# Patient Record
Sex: Female | Born: 1975 | Race: Black or African American | Hispanic: No | Marital: Married | Smoking: Never smoker
Health system: Southern US, Community
[De-identification: ages and names within clinical notes are randomized; demographics above are authoritative.]

## PROBLEM LIST (undated history)

## (undated) DIAGNOSIS — Z789 Other specified health status: Secondary | ICD-10-CM

## (undated) DIAGNOSIS — O24419 Gestational diabetes mellitus in pregnancy, unspecified control: Secondary | ICD-10-CM

## (undated) DIAGNOSIS — K219 Gastro-esophageal reflux disease without esophagitis: Secondary | ICD-10-CM

## (undated) DIAGNOSIS — O139 Gestational [pregnancy-induced] hypertension without significant proteinuria, unspecified trimester: Secondary | ICD-10-CM

## (undated) HISTORY — DX: Gestational (pregnancy-induced) hypertension without significant proteinuria, unspecified trimester: O13.9

## (undated) HISTORY — PX: NO PAST SURGERIES: SHX2092

## (undated) HISTORY — DX: Gastro-esophageal reflux disease without esophagitis: K21.9

## (undated) HISTORY — DX: Gestational diabetes mellitus in pregnancy, unspecified control: O24.419

---

## 1998-11-16 ENCOUNTER — Inpatient Hospital Stay (HOSPITAL_COMMUNITY): Admission: AD | Admit: 1998-11-16 | Discharge: 1998-11-18 | Payer: Self-pay | Admitting: *Deleted

## 1998-11-21 ENCOUNTER — Encounter: Admission: RE | Admit: 1998-11-21 | Discharge: 1999-02-19 | Payer: Self-pay | Admitting: *Deleted

## 2002-03-31 ENCOUNTER — Ambulatory Visit (HOSPITAL_COMMUNITY): Admission: RE | Admit: 2002-03-31 | Discharge: 2002-03-31 | Payer: Self-pay | Admitting: *Deleted

## 2002-04-06 ENCOUNTER — Encounter: Admission: RE | Admit: 2002-04-06 | Discharge: 2002-04-06 | Payer: Self-pay | Admitting: *Deleted

## 2002-07-01 ENCOUNTER — Encounter: Payer: Self-pay | Admitting: Obstetrics and Gynecology

## 2002-07-01 ENCOUNTER — Inpatient Hospital Stay (HOSPITAL_COMMUNITY): Admission: AD | Admit: 2002-07-01 | Discharge: 2002-07-03 | Payer: Self-pay | Admitting: *Deleted

## 2005-04-08 ENCOUNTER — Inpatient Hospital Stay (HOSPITAL_COMMUNITY): Admission: AD | Admit: 2005-04-08 | Discharge: 2005-04-08 | Payer: Self-pay | Admitting: Gynecology

## 2005-04-11 ENCOUNTER — Inpatient Hospital Stay (HOSPITAL_COMMUNITY): Admission: AD | Admit: 2005-04-11 | Discharge: 2005-04-11 | Payer: Self-pay | Admitting: Obstetrics & Gynecology

## 2005-07-07 ENCOUNTER — Ambulatory Visit (HOSPITAL_COMMUNITY): Admission: RE | Admit: 2005-07-07 | Discharge: 2005-07-07 | Payer: Self-pay | Admitting: Gynecology

## 2005-08-04 ENCOUNTER — Ambulatory Visit (HOSPITAL_COMMUNITY): Admission: RE | Admit: 2005-08-04 | Discharge: 2005-08-04 | Payer: Self-pay | Admitting: Obstetrics and Gynecology

## 2005-12-22 ENCOUNTER — Ambulatory Visit: Payer: Self-pay | Admitting: Gynecology

## 2005-12-22 ENCOUNTER — Inpatient Hospital Stay (HOSPITAL_COMMUNITY): Admission: AD | Admit: 2005-12-22 | Discharge: 2005-12-24 | Payer: Self-pay | Admitting: *Deleted

## 2009-09-13 ENCOUNTER — Ambulatory Visit: Payer: Self-pay | Admitting: Nurse Practitioner

## 2009-09-13 ENCOUNTER — Inpatient Hospital Stay (HOSPITAL_COMMUNITY): Admission: AD | Admit: 2009-09-13 | Discharge: 2009-09-14 | Payer: Self-pay | Admitting: Obstetrics and Gynecology

## 2009-09-25 ENCOUNTER — Inpatient Hospital Stay (HOSPITAL_COMMUNITY): Admission: AD | Admit: 2009-09-25 | Discharge: 2009-09-26 | Payer: Self-pay | Admitting: Obstetrics & Gynecology

## 2009-09-25 ENCOUNTER — Ambulatory Visit: Payer: Self-pay | Admitting: Obstetrics and Gynecology

## 2009-10-21 ENCOUNTER — Encounter
Admission: RE | Admit: 2009-10-21 | Discharge: 2009-11-04 | Payer: Self-pay | Source: Home / Self Care | Attending: Obstetrics and Gynecology | Admitting: Obstetrics and Gynecology

## 2009-10-21 ENCOUNTER — Ambulatory Visit: Payer: Self-pay | Admitting: Obstetrics and Gynecology

## 2009-10-22 ENCOUNTER — Ambulatory Visit (HOSPITAL_COMMUNITY): Admission: RE | Admit: 2009-10-22 | Discharge: 2009-10-22 | Payer: Self-pay | Admitting: Family Medicine

## 2009-10-28 ENCOUNTER — Encounter: Payer: Self-pay | Admitting: Family Medicine

## 2009-10-28 ENCOUNTER — Ambulatory Visit: Payer: Self-pay | Admitting: Obstetrics and Gynecology

## 2009-10-28 ENCOUNTER — Ambulatory Visit (HOSPITAL_COMMUNITY): Admission: RE | Admit: 2009-10-28 | Discharge: 2009-10-28 | Payer: Self-pay | Admitting: Family Medicine

## 2009-11-04 ENCOUNTER — Ambulatory Visit: Payer: Self-pay | Admitting: Obstetrics and Gynecology

## 2009-11-18 ENCOUNTER — Ambulatory Visit: Payer: Self-pay | Admitting: Family Medicine

## 2009-11-19 ENCOUNTER — Encounter: Payer: Self-pay | Admitting: Family Medicine

## 2009-11-25 ENCOUNTER — Ambulatory Visit: Payer: Self-pay | Admitting: Obstetrics & Gynecology

## 2009-12-05 ENCOUNTER — Ambulatory Visit (HOSPITAL_COMMUNITY)
Admission: RE | Admit: 2009-12-05 | Discharge: 2009-12-05 | Payer: Self-pay | Source: Home / Self Care | Admitting: Family Medicine

## 2009-12-09 ENCOUNTER — Ambulatory Visit: Payer: Self-pay | Admitting: Obstetrics & Gynecology

## 2009-12-16 ENCOUNTER — Ambulatory Visit: Payer: Self-pay | Admitting: Obstetrics & Gynecology

## 2009-12-30 ENCOUNTER — Ambulatory Visit: Payer: Self-pay | Admitting: Obstetrics and Gynecology

## 2010-01-16 ENCOUNTER — Ambulatory Visit: Payer: Self-pay | Admitting: Obstetrics & Gynecology

## 2010-01-16 ENCOUNTER — Encounter: Payer: Self-pay | Admitting: Family Medicine

## 2010-01-16 ENCOUNTER — Ambulatory Visit (HOSPITAL_COMMUNITY)
Admission: RE | Admit: 2010-01-16 | Discharge: 2010-01-16 | Payer: Self-pay | Source: Home / Self Care | Attending: Family Medicine | Admitting: Family Medicine

## 2010-01-20 ENCOUNTER — Ambulatory Visit: Admit: 2010-01-20 | Payer: Self-pay | Admitting: Obstetrics & Gynecology

## 2010-01-27 ENCOUNTER — Encounter (INDEPENDENT_AMBULATORY_CARE_PROVIDER_SITE_OTHER): Payer: Self-pay | Admitting: *Deleted

## 2010-01-27 ENCOUNTER — Ambulatory Visit
Admission: RE | Admit: 2010-01-27 | Discharge: 2010-01-27 | Payer: Self-pay | Source: Home / Self Care | Attending: Obstetrics & Gynecology | Admitting: Obstetrics & Gynecology

## 2010-01-27 LAB — CONVERTED CEMR LAB
HCT: 28.9 % — ABNORMAL LOW
HIV: NONREACTIVE
Hemoglobin: 8.9 g/dL — ABNORMAL LOW
MCHC: 30.8 g/dL
MCV: 78.3 fL
Platelets: 274 K/uL
RBC: 3.69 M/uL — ABNORMAL LOW
RDW: 13.2 %
WBC: 10.1 10*3/microliter

## 2010-02-03 LAB — POCT URINALYSIS DIPSTICK
Bilirubin Urine: NEGATIVE
Hgb urine dipstick: NEGATIVE
Ketones, ur: NEGATIVE mg/dL
Nitrite: NEGATIVE
Protein, ur: NEGATIVE mg/dL
Specific Gravity, Urine: >=1.03 (ref 1.005–1.030)
Urine Glucose, Fasting: NEGATIVE mg/dL
Urobilinogen, UA: 1 mg/dL (ref 0.0–1.0)
pH: 6 (ref 5.0–8.0)

## 2010-02-09 ENCOUNTER — Encounter: Payer: Self-pay | Admitting: Obstetrics and Gynecology

## 2010-02-10 ENCOUNTER — Ambulatory Visit
Admission: RE | Admit: 2010-02-10 | Discharge: 2010-02-10 | Payer: Self-pay | Source: Home / Self Care | Attending: Obstetrics and Gynecology | Admitting: Obstetrics and Gynecology

## 2010-02-10 ENCOUNTER — Encounter: Payer: Self-pay | Admitting: Family Medicine

## 2010-02-10 LAB — CONVERTED CEMR LAB
Hemoglobin: 9.1 g/dL — ABNORMAL LOW (ref 12.0–15.0)
Iron: 212 ug/dL — ABNORMAL HIGH (ref 42–145)
MCHC: 32.5 g/dL (ref 30.0–36.0)
Platelets: 284 10*3/uL (ref 150–400)
RDW: 14 % (ref 11.5–15.5)
Saturation Ratios: 41 % (ref 20–55)
TIBC: 519 ug/dL — ABNORMAL HIGH (ref 250–470)
UIBC: 307 ug/dL

## 2010-02-13 ENCOUNTER — Encounter: Payer: Self-pay | Admitting: Family Medicine

## 2010-02-13 ENCOUNTER — Ambulatory Visit (HOSPITAL_COMMUNITY)
Admission: RE | Admit: 2010-02-13 | Discharge: 2010-02-13 | Payer: Self-pay | Source: Home / Self Care | Attending: Family Medicine | Admitting: Family Medicine

## 2010-02-13 ENCOUNTER — Other Ambulatory Visit: Payer: Self-pay | Admitting: Family Medicine

## 2010-02-13 DIAGNOSIS — IMO0001 Reserved for inherently not codable concepts without codable children: Secondary | ICD-10-CM

## 2010-02-24 ENCOUNTER — Other Ambulatory Visit: Payer: Self-pay

## 2010-02-24 DIAGNOSIS — O24919 Unspecified diabetes mellitus in pregnancy, unspecified trimester: Secondary | ICD-10-CM

## 2010-02-24 DIAGNOSIS — O30009 Twin pregnancy, unspecified number of placenta and unspecified number of amniotic sacs, unspecified trimester: Secondary | ICD-10-CM

## 2010-02-24 LAB — POCT URINALYSIS DIPSTICK
Bilirubin Urine: NEGATIVE
Nitrite: NEGATIVE
Urine Glucose, Fasting: NEGATIVE mg/dL
Urobilinogen, UA: 0.2 mg/dL (ref 0.0–1.0)

## 2010-03-13 ENCOUNTER — Other Ambulatory Visit: Payer: Self-pay | Admitting: Family Medicine

## 2010-03-13 ENCOUNTER — Ambulatory Visit (HOSPITAL_COMMUNITY)
Admission: RE | Admit: 2010-03-13 | Discharge: 2010-03-13 | Disposition: A | Discharge status: Home / Self Care | Payer: Medicaid Other | Source: Ambulatory Visit | Attending: Family Medicine | Admitting: Family Medicine

## 2010-03-13 ENCOUNTER — Encounter (HOSPITAL_COMMUNITY): Payer: Self-pay

## 2010-03-13 DIAGNOSIS — IMO0001 Reserved for inherently not codable concepts without codable children: Secondary | ICD-10-CM

## 2010-03-13 DIAGNOSIS — O30009 Twin pregnancy, unspecified number of placenta and unspecified number of amniotic sacs, unspecified trimester: Secondary | ICD-10-CM | POA: Insufficient documentation

## 2010-03-13 DIAGNOSIS — O24919 Unspecified diabetes mellitus in pregnancy, unspecified trimester: Secondary | ICD-10-CM | POA: Insufficient documentation

## 2010-03-17 ENCOUNTER — Other Ambulatory Visit: Payer: Self-pay

## 2010-03-17 DIAGNOSIS — O409XX0 Polyhydramnios, unspecified trimester, not applicable or unspecified: Secondary | ICD-10-CM

## 2010-03-17 DIAGNOSIS — IMO0002 Reserved for concepts with insufficient information to code with codable children: Secondary | ICD-10-CM

## 2010-03-17 DIAGNOSIS — O24919 Unspecified diabetes mellitus in pregnancy, unspecified trimester: Secondary | ICD-10-CM

## 2010-03-17 LAB — POCT URINALYSIS DIPSTICK
Bilirubin Urine: NEGATIVE
Ketones, ur: NEGATIVE mg/dL
Nitrite: NEGATIVE
Urine Glucose, Fasting: NEGATIVE mg/dL
pH: 6 (ref 5.0–8.0)

## 2010-03-27 ENCOUNTER — Inpatient Hospital Stay (HOSPITAL_COMMUNITY)
Admission: AD | Admit: 2010-03-27 | Discharge: 2010-03-27 | Disposition: A | Discharge status: Home / Self Care | Payer: Medicaid Other | Source: Ambulatory Visit | Attending: Obstetrics & Gynecology | Admitting: Obstetrics & Gynecology

## 2010-03-27 DIAGNOSIS — R109 Unspecified abdominal pain: Secondary | ICD-10-CM | POA: Insufficient documentation

## 2010-03-27 DIAGNOSIS — O4700 False labor before 37 completed weeks of gestation, unspecified trimester: Secondary | ICD-10-CM | POA: Insufficient documentation

## 2010-03-27 LAB — URINALYSIS, ROUTINE W REFLEX MICROSCOPIC
Bilirubin Urine: NEGATIVE
Nitrite: NEGATIVE
Specific Gravity, Urine: >1.03 — ABNORMAL HIGH (ref 1.005–1.030)
Urobilinogen, UA: 0.2 mg/dL (ref 0.0–1.0)
pH: 6 (ref 5.0–8.0)

## 2010-03-27 LAB — URINE MICROSCOPIC-ADD ON

## 2010-03-31 ENCOUNTER — Ambulatory Visit (HOSPITAL_COMMUNITY)
Admission: RE | Admit: 2010-03-31 | Discharge: 2010-03-31 | Disposition: A | Discharge status: Home / Self Care | Payer: Medicaid Other | Source: Ambulatory Visit | Attending: Obstetrics & Gynecology | Admitting: Obstetrics & Gynecology

## 2010-03-31 ENCOUNTER — Other Ambulatory Visit: Payer: Self-pay | Admitting: Obstetrics & Gynecology

## 2010-03-31 ENCOUNTER — Encounter: Payer: Self-pay | Admitting: Obstetrics & Gynecology

## 2010-03-31 ENCOUNTER — Other Ambulatory Visit: Payer: Self-pay

## 2010-03-31 DIAGNOSIS — O24919 Unspecified diabetes mellitus in pregnancy, unspecified trimester: Secondary | ICD-10-CM

## 2010-03-31 DIAGNOSIS — IMO0002 Reserved for concepts with insufficient information to code with codable children: Secondary | ICD-10-CM

## 2010-03-31 DIAGNOSIS — O30009 Twin pregnancy, unspecified number of placenta and unspecified number of amniotic sacs, unspecified trimester: Secondary | ICD-10-CM | POA: Insufficient documentation

## 2010-03-31 LAB — CONVERTED CEMR LAB
CO2: 20 meq/L (ref 19–32)
Chlamydia, DNA Probe: NEGATIVE
Creatinine, Ser: 0.41 mg/dL (ref 0.40–1.20)
GC Probe Amp, Genital: NEGATIVE
Glucose, Bld: 93 mg/dL (ref 70–99)
HCT: 37.8 % (ref 36.0–46.0)
MCHC: 31.2 g/dL (ref 30.0–36.0)
MCV: 82.4 fL (ref 78.0–100.0)
Platelets: 186 10*3/uL (ref 150–400)
RBC: 4.59 M/uL (ref 3.87–5.11)
Total Bilirubin: 0.2 mg/dL — ABNORMAL LOW (ref 0.3–1.2)
WBC: 8.9 10*3/uL (ref 4.0–10.5)

## 2010-03-31 LAB — POCT URINALYSIS DIPSTICK
Glucose, UA: NEGATIVE mg/dL
Nitrite: NEGATIVE
Nitrite: NEGATIVE
Nitrite: NEGATIVE
Protein, ur: 100 mg/dL — AB
Protein, ur: NEGATIVE mg/dL
Specific Gravity, Urine: 1.025 (ref 1.005–1.030)
Urobilinogen, UA: 0.2 mg/dL (ref 0.0–1.0)
Urobilinogen, UA: 0.2 mg/dL (ref 0.0–1.0)
Urobilinogen, UA: 0.2 mg/dL (ref 0.0–1.0)
pH: 5.5 (ref 5.0–8.0)
pH: 5.5 (ref 5.0–8.0)

## 2010-04-01 ENCOUNTER — Encounter: Payer: Self-pay | Admitting: Obstetrics & Gynecology

## 2010-04-01 LAB — POCT URINALYSIS DIPSTICK
Glucose, UA: NEGATIVE mg/dL
Nitrite: NEGATIVE
Nitrite: NEGATIVE
Protein, ur: NEGATIVE mg/dL
Protein, ur: NEGATIVE mg/dL
Specific Gravity, Urine: 1.025 (ref 1.005–1.030)
Specific Gravity, Urine: >=1.03 (ref 1.005–1.030)
Urobilinogen, UA: 0.2 mg/dL (ref 0.0–1.0)
Urobilinogen, UA: 1 mg/dL (ref 0.0–1.0)

## 2010-04-01 LAB — GLUCOSE, CAPILLARY: Glucose-Capillary: 133 mg/dL — ABNORMAL HIGH (ref 70–99)

## 2010-04-02 ENCOUNTER — Encounter: Payer: Self-pay | Admitting: Advanced Practice Midwife

## 2010-04-02 LAB — POCT URINALYSIS DIPSTICK
Glucose, UA: NEGATIVE mg/dL
Glucose, UA: NEGATIVE mg/dL
Ketones, ur: NEGATIVE mg/dL
Nitrite: NEGATIVE
Nitrite: NEGATIVE
Nitrite: NEGATIVE
Protein, ur: NEGATIVE mg/dL
Protein, ur: NEGATIVE mg/dL
Urobilinogen, UA: 1 mg/dL (ref 0.0–1.0)
Urobilinogen, UA: 1 mg/dL (ref 0.0–1.0)
Urobilinogen, UA: 1 mg/dL (ref 0.0–1.0)
pH: 5.5 (ref 5.0–8.0)

## 2010-04-02 LAB — CONVERTED CEMR LAB
Creatinine 24 HR UR: 1366 mg/24hr (ref 700–1800)
Creatinine, Urine: 116.3 mg/dL

## 2010-04-03 LAB — URINALYSIS, ROUTINE W REFLEX MICROSCOPIC
Glucose, UA: NEGATIVE mg/dL
Nitrite: NEGATIVE
Protein, ur: NEGATIVE mg/dL
Specific Gravity, Urine: >1.03 — ABNORMAL HIGH (ref 1.005–1.030)
Urobilinogen, UA: 4 mg/dL — ABNORMAL HIGH (ref 0.0–1.0)
Urobilinogen, UA: 1 mg/dL (ref 0.0–1.0)

## 2010-04-03 LAB — COMPREHENSIVE METABOLIC PANEL
Albumin: 3.2 g/dL — ABNORMAL LOW (ref 3.5–5.2)
Alkaline Phosphatase: 75 U/L (ref 39–117)
BUN: 6 mg/dL (ref 6–23)
Calcium: 9.1 mg/dL (ref 8.4–10.5)
Creatinine, Ser: 0.39 mg/dL — ABNORMAL LOW (ref 0.4–1.2)
Potassium: 3.8 mEq/L (ref 3.5–5.1)
Total Protein: 7.2 g/dL (ref 6.0–8.3)

## 2010-04-03 LAB — CBC
MCV: 80.4 fL (ref 78.0–100.0)
Platelets: 253 10*3/uL (ref 150–400)
RDW: 14.1 % (ref 11.5–15.5)
WBC: 10.8 10*3/uL — ABNORMAL HIGH (ref 4.0–10.5)

## 2010-04-03 LAB — URINE MICROSCOPIC-ADD ON

## 2010-04-07 ENCOUNTER — Other Ambulatory Visit: Payer: Self-pay

## 2010-04-07 ENCOUNTER — Inpatient Hospital Stay (HOSPITAL_COMMUNITY)
Admission: AD | Admit: 2010-04-07 | Discharge: 2010-04-14 | DRG: 774 | Disposition: A | Discharge status: Home / Self Care | Payer: Medicaid Other | Source: Ambulatory Visit | Attending: Obstetrics and Gynecology | Admitting: Obstetrics and Gynecology

## 2010-04-07 DIAGNOSIS — O99814 Abnormal glucose complicating childbirth: Secondary | ICD-10-CM | POA: Diagnosis present

## 2010-04-07 DIAGNOSIS — O30049 Twin pregnancy, dichorionic/diamniotic, unspecified trimester: Secondary | ICD-10-CM

## 2010-04-07 DIAGNOSIS — IMO0002 Reserved for concepts with insufficient information to code with codable children: Principal | ICD-10-CM | POA: Diagnosis present

## 2010-04-07 DIAGNOSIS — O24919 Unspecified diabetes mellitus in pregnancy, unspecified trimester: Secondary | ICD-10-CM

## 2010-04-07 DIAGNOSIS — O30009 Twin pregnancy, unspecified number of placenta and unspecified number of amniotic sacs, unspecified trimester: Secondary | ICD-10-CM

## 2010-04-07 LAB — POCT URINALYSIS DIP (DEVICE)
Specific Gravity, Urine: >=1.03 (ref 1.005–1.030)
pH: 5.5 (ref 5.0–8.0)

## 2010-04-07 LAB — CBC
HCT: 36.9 % (ref 36.0–46.0)
Hemoglobin: 11.5 g/dL — ABNORMAL LOW (ref 12.0–15.0)
MCH: 25.3 pg — ABNORMAL LOW (ref 26.0–34.0)
MCHC: 31.2 g/dL (ref 30.0–36.0)
MCV: 81.1 fL (ref 78.0–100.0)

## 2010-04-07 LAB — COMPREHENSIVE METABOLIC PANEL
CO2: 23 mEq/L (ref 19–32)
Calcium: 8.4 mg/dL (ref 8.4–10.5)
Chloride: 106 mEq/L (ref 96–112)
Creatinine, Ser: 0.46 mg/dL (ref 0.4–1.2)
GFR calc non Af Amer: >60 mL/min (ref >60)
Glucose, Bld: 78 mg/dL (ref 70–99)
Total Bilirubin: 0.4 mg/dL (ref 0.3–1.2)

## 2010-04-07 LAB — LACTATE DEHYDROGENASE: LDH: 150 U/L (ref 94–250)

## 2010-04-07 LAB — GLUCOSE, CAPILLARY: Glucose-Capillary: 96 mg/dL (ref 70–99)

## 2010-04-08 ENCOUNTER — Observation Stay (HOSPITAL_COMMUNITY): Payer: Medicaid Other

## 2010-04-08 LAB — PROTEIN, URINE, 24 HOUR
Collection Interval-UPROT: 24 hours
Protein, Urine: 98 mg/dL

## 2010-04-08 LAB — CREATININE CLEARANCE, URINE, 24 HOUR: Urine Total Volume-CRCL: 2000 mL

## 2010-04-08 LAB — GLUCOSE, CAPILLARY
Glucose-Capillary: 129 mg/dL — ABNORMAL HIGH (ref 70–99)
Glucose-Capillary: 105 mg/dL — ABNORMAL HIGH (ref 70–99)

## 2010-04-09 LAB — GLUCOSE, CAPILLARY: Glucose-Capillary: 110 mg/dL — ABNORMAL HIGH (ref 70–99)

## 2010-04-10 ENCOUNTER — Ambulatory Visit (HOSPITAL_COMMUNITY): Admission: RE | Admit: 2010-04-10 | Payer: Medicaid Other | Source: Ambulatory Visit

## 2010-04-10 ENCOUNTER — Ambulatory Visit (HOSPITAL_COMMUNITY): Payer: Medicaid Other

## 2010-04-10 ENCOUNTER — Other Ambulatory Visit: Payer: Self-pay | Admitting: Family Medicine

## 2010-04-10 DIAGNOSIS — O30049 Twin pregnancy, dichorionic/diamniotic, unspecified trimester: Secondary | ICD-10-CM

## 2010-04-10 DIAGNOSIS — O30009 Twin pregnancy, unspecified number of placenta and unspecified number of amniotic sacs, unspecified trimester: Secondary | ICD-10-CM

## 2010-04-10 DIAGNOSIS — IMO0002 Reserved for concepts with insufficient information to code with codable children: Secondary | ICD-10-CM

## 2010-04-10 DIAGNOSIS — O99814 Abnormal glucose complicating childbirth: Secondary | ICD-10-CM

## 2010-04-10 LAB — GLUCOSE, CAPILLARY
Glucose-Capillary: 81 mg/dL (ref 70–99)
Glucose-Capillary: 76 mg/dL (ref 70–99)

## 2010-04-10 LAB — CBC
HCT: 38.9 % (ref 36.0–46.0)
Hemoglobin: 12.2 g/dL (ref 12.0–15.0)
RDW: 18.4 % — ABNORMAL HIGH (ref 11.5–15.5)
WBC: 9.7 10*3/uL (ref 4.0–10.5)

## 2010-04-11 LAB — CBC
HCT: 31.7 % — ABNORMAL LOW (ref 36.0–46.0)
Platelets: 159 10*3/uL (ref 150–400)
RDW: 18.2 % — ABNORMAL HIGH (ref 11.5–15.5)
WBC: 12 10*3/uL — ABNORMAL HIGH (ref 4.0–10.5)

## 2010-04-11 LAB — GLUCOSE, CAPILLARY: Glucose-Capillary: 65 mg/dL — ABNORMAL LOW (ref 70–99)

## 2010-04-28 NOTE — Discharge Summary (Signed)
  NAMEHERAN, CAMPAU                   ACCOUNT NO.:  [id number]  MEDICAL RECORD NO.:  [id number]           PATIENT TYPE:  I  LOCATION:  9144                          FACILITY:  [facility]  PHYSICIAN:  Lauren Chin, MD DATE OF BIRTH:  1975-02-16  DATE OF ADMISSION:  04/07/2010 DATE OF DISCHARGE:  04/14/2010                              DISCHARGE SUMMARY   Lauren Doyle was admitted on April 07, 2010, for pregnancy at 36 weeks and 4 days with elevated blood pressure and proteinuria.  After several days of evaluation on antenatal unit, the patient's blood pressures became unstable and a 24-hour urine showed proteinuria.  At that time, it was felt prudent to induce for severe preeclampsia.  She labored well and delivered viable female and female infants vaginally without any complication, was placed on mag sulfate therapy prior to and 24 hours after delivery.  At that time after delivery, she was placed on labetalol 200 b.i.d. and then hydrochlorothiazide was also added to the regimen which she has responded well to in the past 24 hours with only one elevation of a diastolic of 105 that was less than prior to taking her bedtime labetalol.  She is up ambulating well, taking p.o. fluids and solids well, and is ready for discharge today.  Discharge medications are as follows: 1. Hydrochlorothiazide 25 mg p.o. q.a.m. 2. Labetalol 200 b.i.d. 3. Percocet 5/325 one p.o. q.4 h. p.r.n. pain. 4. Motrin 600 p.o. 6 hours p.r.n. cramping.  PHYSICAL EXAMINATION TODAY:  HEART:  Regular in rhythm and rate. LUNGS:  Clear to auscultation bilaterally. ABDOMEN:  Soft.  Fundus is firm. VAGINA:  Lochia small amount. EXTREMITIES:  There is 1+ pitting edema in the lower extremities.  ASSESSMENT:  Stable postpartum day #4 preeclampsia.  PLAN:  We are going to discharge her home.  The nurse is going to follow up on postpartum day 5 through 7 for her blood pressure check to see how she and the twins are doing and she  is to follow up at [facility] Department in 6 weeks.     Lauren Doyle, N.M.   ______________________________ Lauren Chin, MD    DL/MEDQ  D:  91/47/8295  T:  04/15/2010  Job:  [id number]  cc:   [facility] Department  Electronically Signed by Wyvonnia Dusky N.M. on 04/20/2010 10:32:56 AM Electronically Signed by Jaynie Collins MD on 04/28/2010 11:53:13 AM

## 2010-11-04 ENCOUNTER — Encounter (HOSPITAL_COMMUNITY): Payer: Self-pay | Admitting: *Deleted

## 2011-02-14 IMAGING — US US OB DETAIL+14 WK
1 series · 13 of 28 positions shown · non-contrast
Comparison: none

[Series 1: us ob detail+14 wk · 13 of 147 slices shown]
[im 6/147]
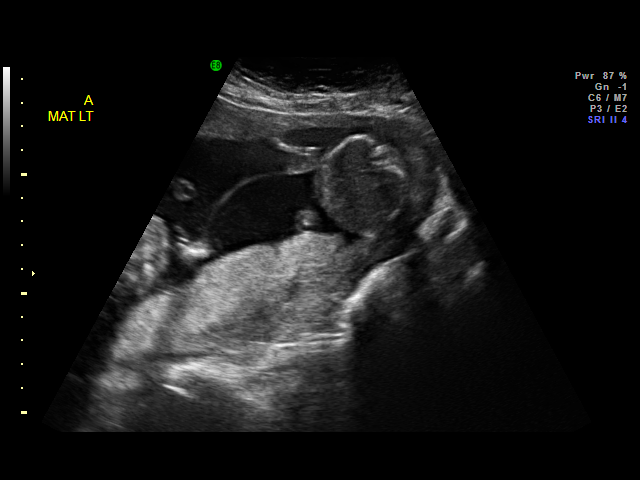
[im 17/147]
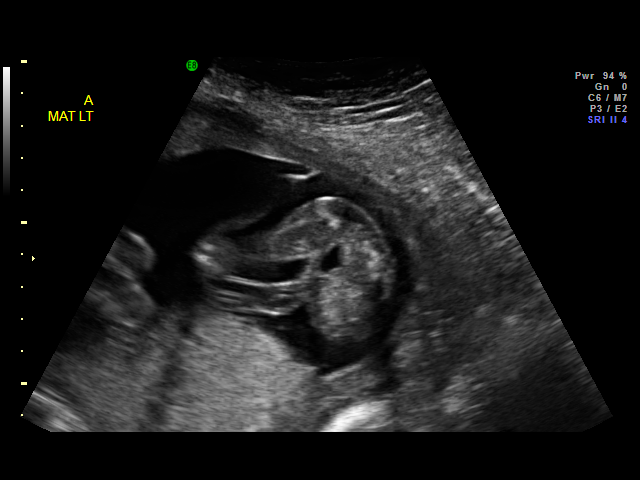
[im 28/147]
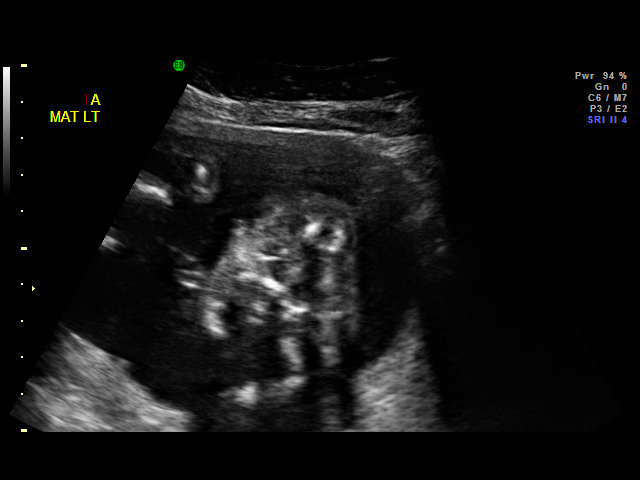
[im 38/147]
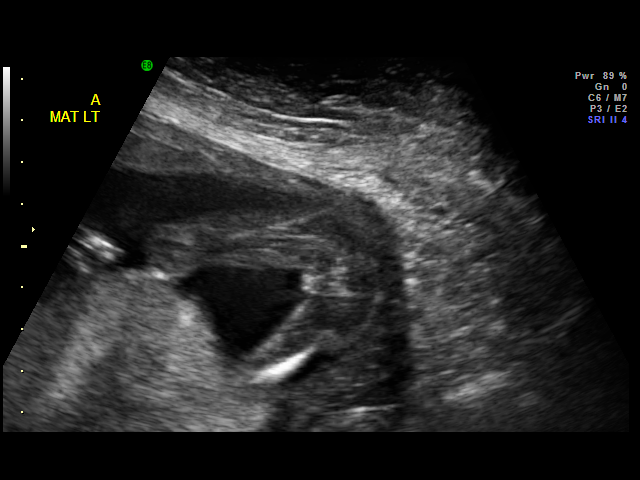
[im 49/147]
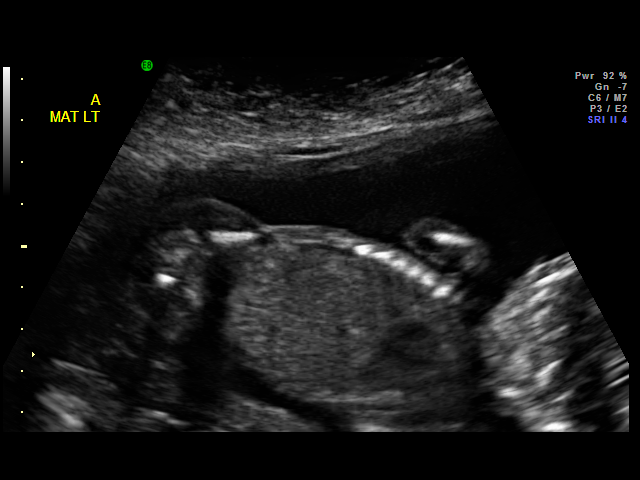
[im 60/147]
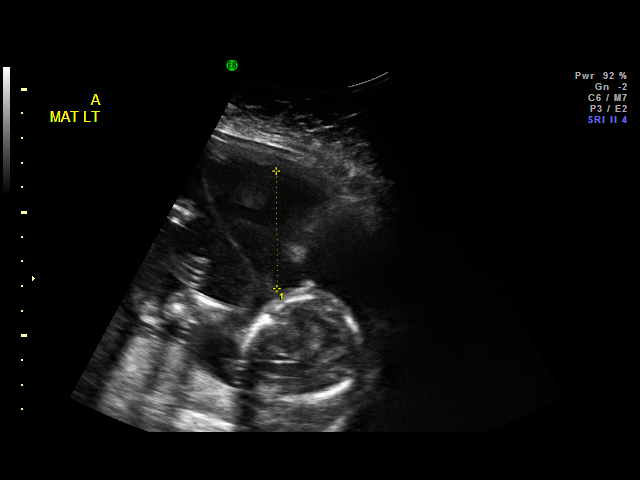
[im 76/147]
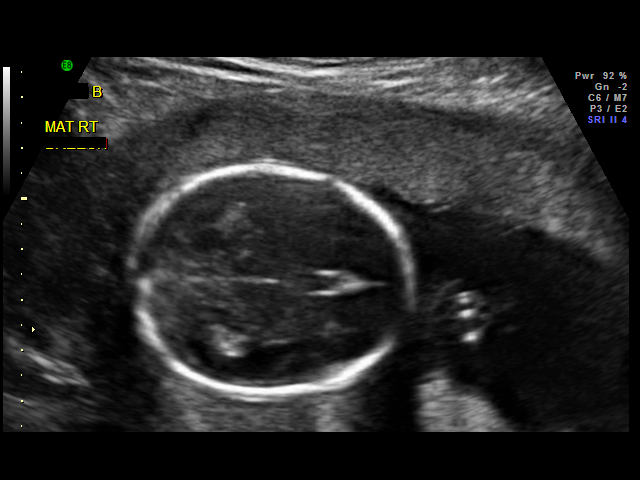
[im 87/147]
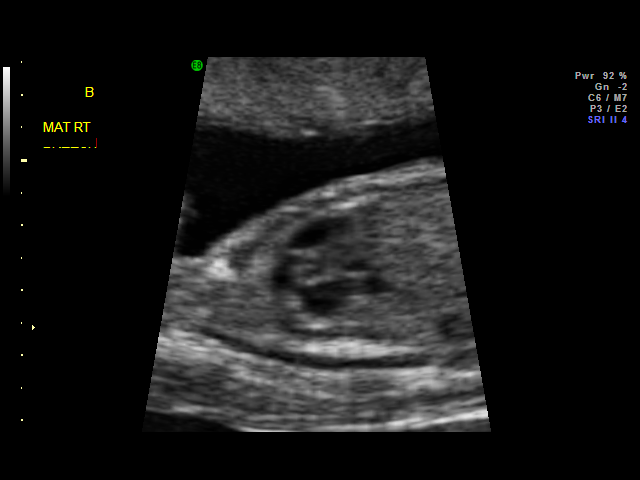
[im 98/147]
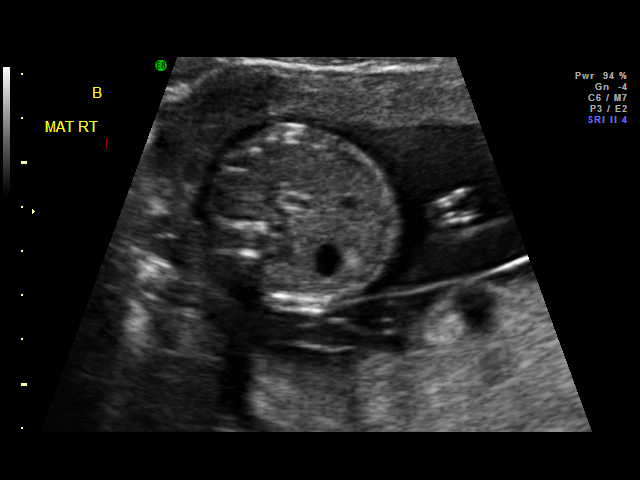
[im 109/147]
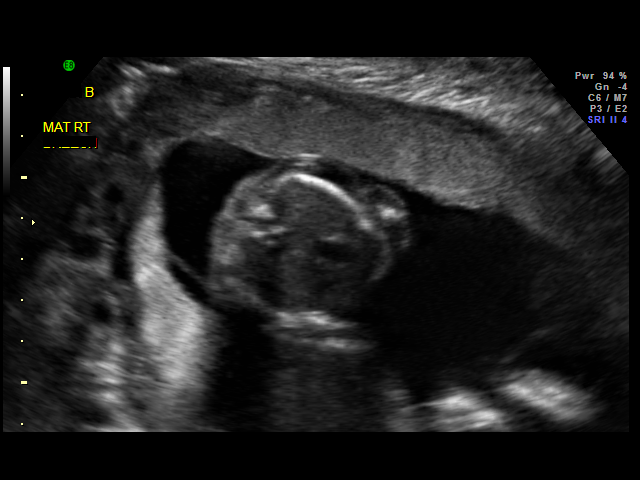
[im 119/147]
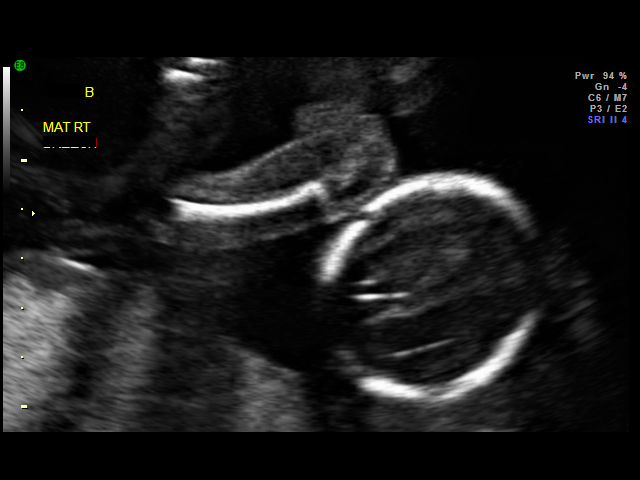
[im 130/147]
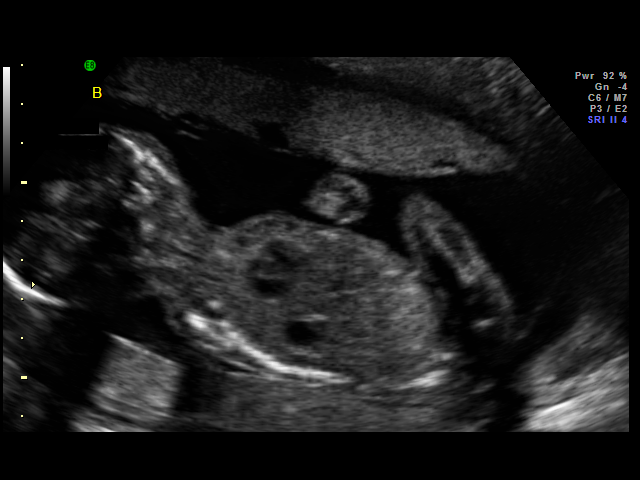
[im 141/147]
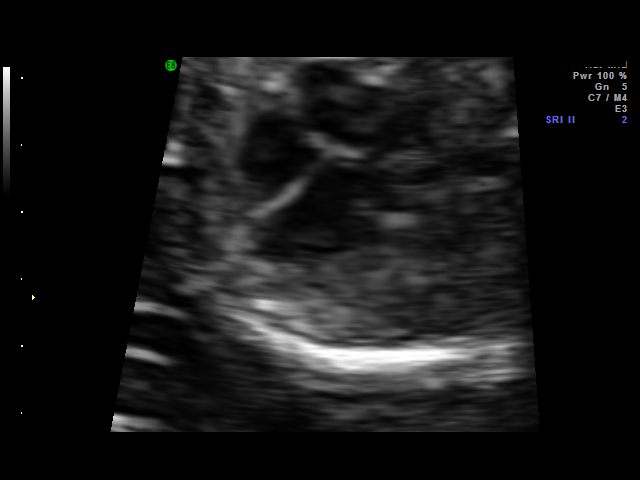

[13 of 28 positions shown; findings below may reference images not displayed]

OBSTETRICS REPORT
                      (Signed Final 12/05/2009 [DATE])

                 [REDACTED]-                       [HOSPITAL]
                 Faculty Physician
 Order#:         _O
Procedures

 US OB DETAIL +14 WK                                   [id number]
 US OB Detail Add'l Gest + 14 WK                       [id number]
Indications

 Twin gestation, Di-Di
 Diabetes - Pregestational, Class B
Fetal Evaluation (Fetus A)

 Fetal Heart Rate:  135                          bpm
 Cardiac Activity:  Observed
 Fetal Lie:         Maternal left side
 Presentation:      Cephalic
 Placenta:          Posterior, above cervical
                    os
 P. Cord            Visualized
 Insertion:

 Amniotic Fluid
 AFI FV:      Subjectively within normal limits
                                             Larg Pckt:     4.8  cm
Biometry (Fetus A)

 BPD:     44.7  mm     G. Age:  19w 4d                CI:         80.1   70 - 86
 OFD:     55.8  mm                                    FL/HC:      16.8   16.1 -

 HC:     160.3  mm     G. Age:  18w 6d       50  %    HC/AC:      1.24   1.09 -

 AC:     129.6  mm     G. Age:  18w 4d       40  %    FL/BPD:
 FL:        27  mm     G. Age:  18w 1d       27  %    FL/AC:      20.8   20 - 24
 HUM:     27.5  mm     G. Age:  18w 6d       54  %
 CER:     18.9  mm     G. Age:  18w 3d       41  %
 NFT:     2.95  mm

 Est. FW:     241  gm      0 lb 9 oz     40  %     FW Discordancy         8  %
Gestational Age (Fetus A)
 U/S Today:     18w 5d                                        EDD:   05/03/10
 Best:          18w 5d     Det. By:  Early Ultrasound         EDD:   05/03/10
                                     (09/14/09)
Anatomy (Fetus A)

 Cranium:           Appears normal      Aortic Arch:       Appears normal
 Fetal Cavum:       Appears normal      Ductal Arch:       Appears normal
 Ventricles:        Appears normal      Diaphragm:         Appears normal
 Choroid Plexus:    Appears normal      Stomach:           Appears normal
 Cerebellum:        Appears normal      Abdomen:           Appears normal
 Posterior Fossa:   Appears normal      Abdominal Wall:    Appears nml
                                                           (cord insert,
                                                           abd wall)
 Nuchal Fold:       Appears normal      Cord Vessels:      Appears normal
                    (neck, nuchal                          (3 vessel cord)
                    fold)
 Face:              Appears normal      Kidneys:           Appear normal
                    (lips/profile/orbit
                    s)
 Heart:             Appears normal      Bladder:           Appears normal
                    (4 chamber &
                    axis)
 RVOT:              Appears normal      Spine:             Appears normal
 LVOT:              Appears normal      Limbs:             Appears normal
                                                           (hands, ankles,
                                                           feet)

 Other:     Female gender. Heels and 5th digit visualized.

Fetal Evaluation (Fetus B)

 Fetal Heart Rate:  143                          bpm
 Cardiac Activity:  Observed
 Fetal Lie:         Maternal right side
 Presentation:      Breech
 Placenta:          Anterior, above cervical os
 P. Cord            Visualized
 Insertion:

 Amniotic Fluid
 AFI FV:      Subjectively within normal limits
                                             Larg Pckt:     5.5  cm
Biometry (Fetus B)

 BPD:     44.5  mm     G. Age:  19w 3d                CI:         77.7   70 - 86
 OFD:     57.3  mm                                    FL/HC:      17.4   16.1 -

 HC:     163.9  mm     G. Age:  19w 1d       63  %    HC/AC:      1.22   1.09 -

 AC:     134.4  mm     G. Age:  19w 0d       53  %    FL/BPD:     64.0   N/A
 FL:      28.5  mm     G. Age:  18w 5d       45  %    FL/AC:      21.2   20 - 24
 HUM:     27.6  mm     G. Age:  18w 6d       55  %
 CER:     18.5  mm     G. Age:  18w 2d       35  %
 NFT:     3.22  mm

 Est. FW:     262  gm      0 lb 9 oz     49  %     FW Discordancy      0 \ 8 %
Gestational Age (Fetus B)
 U/S Today:     19w 0d                                        EDD:   05/01/10
 Best:          18w 5d     Det. By:  Early Ultrasound         EDD:   05/03/10
                                     (09/14/09)
Anatomy (Fetus B)

 Cranium:           Appears normal      Aortic Arch:       Appears normal
 Fetal Cavum:       Appears normal      Ductal Arch:       Appears normal
 Ventricles:        Appears normal      Diaphragm:         Appears normal
 Choroid Plexus:    Appears normal      Stomach:           Appears normal
 Cerebellum:        Appears normal      Abdomen:           Appears normal
 Posterior Fossa:   Appears normal      Abdominal Wall:    Appears nml
                                                           (cord insert,
                                                           abd wall)
 Nuchal Fold:       Appears normal      Cord Vessels:      Appears normal
                    (neck, nuchal                          (3 vessel cord)
                    fold)
 Face:              Appears normal      Kidneys:           Appear normal
                    (lips/profile/orbit
                    s)
 Heart:             Appears normal      Bladder:           Appears normal
                    (4 chamber &
                    axis)
 RVOT:              Appears normal      Spine:             Appears normal
 LVOT:              Appears normal      Limbs:             Appears normal
                                                           (hands, ankles,
                                                           feet)

 Other:     Male gender. Heels visualized. 5th digit appears normal.
Cervix Uterus Adnexa

 Cervical Length:    4.3      cm

 Cervix:       Normal appearance by transabdominal scan.

 Left Ovary:    Within normal limits.
 Right Ovary:   Within normal limits.
 Adnexa:     No abnormality visualized.
Impression

 Dichorionic/diamniotic twin pregnancy at 18+2 weeks
 Normal level II fetal anatomy x 2
 Markers of aneuploidy: none x 2
 Normal amniotic fluid volume x 2
 Measurements consistant with prior U/S x 2
Recommendations

 Follow-up ultrasound for growth in 6 weeks

 questions or concerns.

## 2013-11-20 ENCOUNTER — Encounter (HOSPITAL_COMMUNITY): Payer: Self-pay | Admitting: *Deleted

## 2014-12-25 LAB — OB RESULTS CONSOLE GC/CHLAMYDIA
CHLAMYDIA, DNA PROBE: NEGATIVE
Gonorrhea: NEGATIVE

## 2014-12-25 LAB — OB RESULTS CONSOLE RUBELLA ANTIBODY, IGM: Rubella: IMMUNE

## 2014-12-25 LAB — OB RESULTS CONSOLE ABO/RH: RH Type: POSITIVE

## 2014-12-25 LAB — OB RESULTS CONSOLE ANTIBODY SCREEN: ANTIBODY SCREEN: NEGATIVE

## 2014-12-25 LAB — OB RESULTS CONSOLE RPR: RPR: NONREACTIVE

## 2014-12-25 LAB — OB RESULTS CONSOLE HIV ANTIBODY (ROUTINE TESTING)
HIV: NONREACTIVE
HIV: NONREACTIVE

## 2014-12-25 LAB — OB RESULTS CONSOLE VARICELLA ZOSTER ANTIBODY, IGG: Varicella: NON-IMMUNE/NOT IMMUNE

## 2014-12-25 LAB — CYSTIC FIBROSIS MUTATION 97: Cystic Fibrosis Mutat: NEGATIVE

## 2014-12-25 LAB — OB RESULTS CONSOLE HEPATITIS B SURFACE ANTIGEN: Hepatitis B Surface Ag: NEGATIVE

## 2014-12-26 ENCOUNTER — Other Ambulatory Visit (HOSPITAL_COMMUNITY): Payer: Self-pay | Admitting: Urology

## 2014-12-26 DIAGNOSIS — Z3689 Encounter for other specified antenatal screening: Secondary | ICD-10-CM

## 2014-12-26 DIAGNOSIS — O09522 Supervision of elderly multigravida, second trimester: Secondary | ICD-10-CM

## 2014-12-26 DIAGNOSIS — Z3A28 28 weeks gestation of pregnancy: Secondary | ICD-10-CM

## 2014-12-27 ENCOUNTER — Ambulatory Visit (HOSPITAL_COMMUNITY): Payer: Medicaid Other

## 2015-01-03 ENCOUNTER — Encounter (HOSPITAL_COMMUNITY): Payer: Medicaid Other

## 2015-01-03 ENCOUNTER — Other Ambulatory Visit (HOSPITAL_COMMUNITY): Payer: Self-pay | Admitting: Urology

## 2015-01-03 ENCOUNTER — Encounter (HOSPITAL_COMMUNITY): Payer: Self-pay

## 2015-01-03 ENCOUNTER — Ambulatory Visit (HOSPITAL_COMMUNITY)
Admission: RE | Admit: 2015-01-03 | Discharge: 2015-01-03 | Disposition: A | Discharge status: Home / Self Care | Payer: Medicaid Other | Source: Ambulatory Visit | Attending: Physician Assistant | Admitting: Physician Assistant

## 2015-01-03 DIAGNOSIS — O09219 Supervision of pregnancy with history of pre-term labor, unspecified trimester: Secondary | ICD-10-CM

## 2015-01-03 DIAGNOSIS — Z8632 Personal history of gestational diabetes: Secondary | ICD-10-CM

## 2015-01-03 DIAGNOSIS — O09899 Supervision of other high risk pregnancies, unspecified trimester: Secondary | ICD-10-CM

## 2015-01-03 DIAGNOSIS — O09292 Supervision of pregnancy with other poor reproductive or obstetric history, second trimester: Secondary | ICD-10-CM | POA: Insufficient documentation

## 2015-01-03 DIAGNOSIS — O09522 Supervision of elderly multigravida, second trimester: Secondary | ICD-10-CM | POA: Insufficient documentation

## 2015-01-03 DIAGNOSIS — O0933 Supervision of pregnancy with insufficient antenatal care, third trimester: Secondary | ICD-10-CM

## 2015-01-03 DIAGNOSIS — Z3689 Encounter for other specified antenatal screening: Secondary | ICD-10-CM

## 2015-01-03 DIAGNOSIS — O09212 Supervision of pregnancy with history of pre-term labor, second trimester: Secondary | ICD-10-CM | POA: Insufficient documentation

## 2015-01-03 DIAGNOSIS — Z36 Encounter for antenatal screening of mother: Secondary | ICD-10-CM | POA: Insufficient documentation

## 2015-01-03 DIAGNOSIS — Z3A28 28 weeks gestation of pregnancy: Secondary | ICD-10-CM | POA: Diagnosis not present

## 2015-01-03 DIAGNOSIS — O09299 Supervision of pregnancy with other poor reproductive or obstetric history, unspecified trimester: Secondary | ICD-10-CM

## 2015-01-20 NOTE — L&D Delivery Note (Signed)
Delivery Note At 3:17 PM a viable and healthy female was delivered via Vaginal, Spontaneous Delivery (Presentation: Left Occiput Anterior).  APGAR: 7, 9; weight pending .   Placenta status: Intact, Spontaneous.  Cord: 3 vessels with the following complications: None.   Anesthesia: Epidural  Episiotomy: None Lacerations: None Suture Repair: Not applicable Est. Blood Loss (mL): 200  Mom to postpartum.  Baby to Couplet care / Skin to Skin. Lauren Doyle is a 40 y.o. female [phone] with IUP at 7990w5d admitted for active labor.  She progressed without augmentation to complete and pushed less than 30 minutes to deliver.  Cord clamping delayed by 1-2 minutes then clamped by CNM and cut by Sharen CounterLisa [name].  Bulb syringe used to suction infant.  Infant not transitioning well.  Taken to warmer for further evaluation.  Infant's apgar improved to 9 at 5 minutes.  Placenta intact and spontaneous, bleeding minimal.  Intact perineum.  Mom and baby stable prior to transfer to postpartum.   [name], [name] 03/25/2015, 4:07 PM

## 2015-02-13 ENCOUNTER — Other Ambulatory Visit (HOSPITAL_COMMUNITY): Payer: Self-pay | Admitting: Maternal and Fetal Medicine

## 2015-02-13 DIAGNOSIS — O09523 Supervision of elderly multigravida, third trimester: Secondary | ICD-10-CM

## 2015-02-13 DIAGNOSIS — O0930 Supervision of pregnancy with insufficient antenatal care, unspecified trimester: Secondary | ICD-10-CM

## 2015-02-13 DIAGNOSIS — Z3A34 34 weeks gestation of pregnancy: Secondary | ICD-10-CM

## 2015-02-13 DIAGNOSIS — O09213 Supervision of pregnancy with history of pre-term labor, third trimester: Secondary | ICD-10-CM

## 2015-02-13 DIAGNOSIS — Z8632 Personal history of gestational diabetes: Secondary | ICD-10-CM

## 2015-02-13 DIAGNOSIS — Z8759 Personal history of other complications of pregnancy, childbirth and the puerperium: Secondary | ICD-10-CM

## 2015-02-13 DIAGNOSIS — O09893 Supervision of other high risk pregnancies, third trimester: Secondary | ICD-10-CM

## 2015-02-14 ENCOUNTER — Encounter (HOSPITAL_COMMUNITY): Payer: Self-pay

## 2015-02-14 ENCOUNTER — Ambulatory Visit (HOSPITAL_COMMUNITY)
Admission: RE | Admit: 2015-02-14 | Discharge: 2015-02-14 | Disposition: A | Discharge status: Home / Self Care | Payer: Medicaid Other | Source: Ambulatory Visit | Attending: Obstetrics and Gynecology | Admitting: Obstetrics and Gynecology

## 2015-02-14 DIAGNOSIS — O09213 Supervision of pregnancy with history of pre-term labor, third trimester: Secondary | ICD-10-CM | POA: Diagnosis not present

## 2015-02-14 DIAGNOSIS — O0933 Supervision of pregnancy with insufficient antenatal care, third trimester: Secondary | ICD-10-CM | POA: Insufficient documentation

## 2015-02-14 DIAGNOSIS — Z8759 Personal history of other complications of pregnancy, childbirth and the puerperium: Secondary | ICD-10-CM

## 2015-02-14 DIAGNOSIS — O09293 Supervision of pregnancy with other poor reproductive or obstetric history, third trimester: Secondary | ICD-10-CM | POA: Insufficient documentation

## 2015-02-14 DIAGNOSIS — Z3A34 34 weeks gestation of pregnancy: Secondary | ICD-10-CM | POA: Diagnosis not present

## 2015-02-14 DIAGNOSIS — O09523 Supervision of elderly multigravida, third trimester: Secondary | ICD-10-CM | POA: Diagnosis present

## 2015-02-14 DIAGNOSIS — O0930 Supervision of pregnancy with insufficient antenatal care, unspecified trimester: Secondary | ICD-10-CM

## 2015-02-14 DIAGNOSIS — O09893 Supervision of other high risk pregnancies, third trimester: Secondary | ICD-10-CM

## 2015-02-14 DIAGNOSIS — Z8632 Personal history of gestational diabetes: Secondary | ICD-10-CM

## 2015-03-25 ENCOUNTER — Encounter (HOSPITAL_COMMUNITY): Payer: Self-pay

## 2015-03-25 ENCOUNTER — Inpatient Hospital Stay (HOSPITAL_COMMUNITY)
Admission: AD | Admit: 2015-03-25 | Discharge: 2015-03-27 | DRG: 775 | Disposition: A | Discharge status: Home / Self Care | Payer: Medicaid Other | Source: Ambulatory Visit | Attending: Family Medicine | Admitting: Family Medicine

## 2015-03-25 DIAGNOSIS — Z3490 Encounter for supervision of normal pregnancy, unspecified, unspecified trimester: Secondary | ICD-10-CM

## 2015-03-25 DIAGNOSIS — O99824 Streptococcus B carrier state complicating childbirth: Secondary | ICD-10-CM | POA: Diagnosis present

## 2015-03-25 DIAGNOSIS — Z3A39 39 weeks gestation of pregnancy: Secondary | ICD-10-CM

## 2015-03-25 DIAGNOSIS — IMO0001 Reserved for inherently not codable concepts without codable children: Secondary | ICD-10-CM

## 2015-03-25 HISTORY — DX: Other specified health status: Z78.9

## 2015-03-25 LAB — CBC
HCT: 28.9 % — ABNORMAL LOW (ref 36.0–46.0)
Hemoglobin: 8.7 g/dL — ABNORMAL LOW (ref 12.0–15.0)
MCH: 20.8 pg — AB (ref 26.0–34.0)
MCHC: 30.1 g/dL (ref 30.0–36.0)
MCV: 69.1 fL — AB (ref 78.0–100.0)
PLATELETS: 262 10*3/uL (ref 150–400)
RBC: 4.18 MIL/uL (ref 3.87–5.11)
RDW: 16.8 % — AB (ref 11.5–15.5)
WBC: 8.3 10*3/uL (ref 4.0–10.5)

## 2015-03-25 LAB — POCT FERN TEST: POCT Fern Test: POSITIVE

## 2015-03-25 LAB — OB RESULTS CONSOLE GBS: STREP GROUP B AG: POSITIVE

## 2015-03-25 MED ORDER — OXYCODONE-ACETAMINOPHEN 5-325 MG PO TABS: 2.0000 | ORAL_TABLET | ORAL | Status: DC | PRN

## 2015-03-25 MED ORDER — DIPHENHYDRAMINE HCL 25 MG PO CAPS: 25.0000 mg | ORAL_CAPSULE | Freq: Four times a day (QID) | ORAL | Status: DC | PRN

## 2015-03-25 MED ORDER — ONDANSETRON HCL 4 MG PO TABS: 4.0000 mg | ORAL_TABLET | ORAL | Status: DC | PRN

## 2015-03-25 MED ORDER — IBUPROFEN 600 MG PO TABS
600.0000 mg | ORAL_TABLET | Freq: Four times a day (QID) | ORAL | Status: DC
  Administered 2015-03-25 – 2015-03-27 (×7): 600 mg via ORAL
  Filled 2015-03-25 (×7): qty 1

## 2015-03-25 MED ORDER — ONDANSETRON HCL 4 MG/2ML IJ SOLN: 4.0000 mg | Freq: Four times a day (QID) | INTRAMUSCULAR | Status: DC | PRN

## 2015-03-25 MED ORDER — ZOLPIDEM TARTRATE 5 MG PO TABS: 5.0000 mg | ORAL_TABLET | Freq: Every evening | ORAL | Status: DC | PRN

## 2015-03-25 MED ORDER — DIBUCAINE 1 % RE OINT: 1.0000 "application " | TOPICAL_OINTMENT | RECTAL | Status: DC | PRN

## 2015-03-25 MED ORDER — LACTATED RINGERS IV SOLN: 500.0000 mL | INTRAVENOUS | Status: DC | PRN

## 2015-03-25 MED ORDER — LIDOCAINE HCL (PF) 1 % IJ SOLN
30.0000 mL | INTRAMUSCULAR | Status: DC | PRN
  Filled 2015-03-25: qty 30

## 2015-03-25 MED ORDER — LIDOCAINE HCL (PF) 1 % IJ SOLN
INTRAMUSCULAR | Status: AC
  Filled 2015-03-25: qty 30

## 2015-03-25 MED ORDER — OXYCODONE-ACETAMINOPHEN 5-325 MG PO TABS: 1.0000 | ORAL_TABLET | ORAL | Status: DC | PRN

## 2015-03-25 MED ORDER — CITRIC ACID-SODIUM CITRATE 334-500 MG/5ML PO SOLN: 30.0000 mL | ORAL | Status: DC | PRN

## 2015-03-25 MED ORDER — BENZOCAINE-MENTHOL 20-0.5 % EX AERO
1.0000 "application " | INHALATION_SPRAY | CUTANEOUS | Status: DC | PRN
  Administered 2015-03-25: 1 via TOPICAL
  Filled 2015-03-25: qty 56

## 2015-03-25 MED ORDER — PRENATAL MULTIVITAMIN CH
1.0000 | ORAL_TABLET | Freq: Every day | ORAL | Status: DC
  Administered 2015-03-26 – 2015-03-27 (×2): 1 via ORAL
  Filled 2015-03-25 (×2): qty 1

## 2015-03-25 MED ORDER — ACETAMINOPHEN 325 MG PO TABS: 650.0000 mg | ORAL_TABLET | ORAL | Status: DC | PRN

## 2015-03-25 MED ORDER — SIMETHICONE 80 MG PO CHEW: 80.0000 mg | CHEWABLE_TABLET | ORAL | Status: DC | PRN

## 2015-03-25 MED ORDER — AMPICILLIN SODIUM 2 G IJ SOLR
2.0000 g | Freq: Once | INTRAMUSCULAR | Status: AC
  Administered 2015-03-25: 2 g via INTRAVENOUS
  Filled 2015-03-25: qty 2000

## 2015-03-25 MED ORDER — SENNOSIDES-DOCUSATE SODIUM 8.6-50 MG PO TABS
2.0000 | ORAL_TABLET | ORAL | Status: DC
  Administered 2015-03-25 – 2015-03-26 (×2): 2 via ORAL
  Filled 2015-03-25 (×2): qty 2

## 2015-03-25 MED ORDER — WITCH HAZEL-GLYCERIN EX PADS: 1.0000 "application " | MEDICATED_PAD | CUTANEOUS | Status: DC | PRN

## 2015-03-25 MED ORDER — LACTATED RINGERS IV SOLN
2.5000 [IU]/h | INTRAVENOUS | Status: DC
  Administered 2015-03-25: 15:00:00 via INTRAVENOUS

## 2015-03-25 MED ORDER — ONDANSETRON HCL 4 MG/2ML IJ SOLN: 4.0000 mg | INTRAMUSCULAR | Status: DC | PRN

## 2015-03-25 MED ORDER — LACTATED RINGERS IV SOLN
INTRAVENOUS | Status: DC
  Administered 2015-03-25: 14:00:00 via INTRAVENOUS

## 2015-03-25 MED ORDER — OXYTOCIN BOLUS FROM INFUSION: 500.0000 mL | INTRAVENOUS | Status: DC

## 2015-03-25 MED ORDER — LANOLIN HYDROUS EX OINT
TOPICAL_OINTMENT | CUTANEOUS | Status: DC | PRN
Start: 2015-03-25 — End: 2015-03-27

## 2015-03-25 MED ORDER — TETANUS-DIPHTH-ACELL PERTUSSIS 5-2.5-18.5 LF-MCG/0.5 IM SUSP: 0.5000 mL | Freq: Once | INTRAMUSCULAR | Status: DC

## 2015-03-25 MED ORDER — OXYTOCIN 10 UNIT/ML IJ SOLN
INTRAMUSCULAR | Status: AC
  Filled 2015-03-25: qty 4

## 2015-03-25 MED ORDER — ACETAMINOPHEN 325 MG PO TABS
650.0000 mg | ORAL_TABLET | ORAL | Status: DC | PRN
  Administered 2015-03-25 – 2015-03-26 (×2): 650 mg via ORAL
  Filled 2015-03-25 (×2): qty 2

## 2015-03-25 NOTE — Progress Notes (Signed)
Delivery of live viable female by Shirlyn GoltzLisa Leftwich Kerby CNM APGARS 7,9

## 2015-03-25 NOTE — Lactation Note (Signed)
This note was copied from a baby's chart. Lactation Consultation Note  Patient Name: Lauren Doyle GNFAO'ZToday's Date: 03/25/2015 Reason for consult: Initial assessment Baby at 5 hr of life and mom reports bf is going well. She wants to bf and offer formula, that's how she feed her other children. Denies breast or nipple pain, no concerns voiced. Discussed baby behavior, feeding frequency, baby belly size, voids, wt loss, breast changes, and nipple care. She stated she knows how to manually express and has seen colostrum bilaterally. Given lactation handouts. Aware of OP services and support group.    Maternal Data Has patient been taught Hand Expression?: Yes Does the patient have breastfeeding experience prior to this delivery?: Yes  Feeding Feeding Type: Breast Fed Length of feed: 60 min  LATCH Score/Interventions Latch: Repeated attempts needed to sustain latch, nipple held in mouth throughout feeding, stimulation needed to elicit sucking reflex. Intervention(s): Adjust position;Assist with latch  Audible Swallowing: A few with stimulation Intervention(s): Skin to skin  Type of Nipple: Everted at rest and after stimulation  Comfort (Breast/Nipple): Soft / non-tender     Hold (Positioning): Assistance needed to correctly position infant at breast and maintain latch.  LATCH Score: 7  Lactation Tools Discussed/Used WIC Program: No   Consult Status Consult Status: Follow-up Date: 03/26/15 Follow-up type: In-patient    Lauren Doyle 03/25/2015, 8:55 PM

## 2015-03-25 NOTE — MAU Note (Signed)
Pt reports clear fluid leaking starting around 1130am. Pt denies bleeding. Pt c/o mild contractions about every 15 minutes. Pt states is moving normally.

## 2015-03-25 NOTE — H&P (Signed)
Lauren Doyle is a 40 y.o. female [phone] @[redacted]w[redacted]d  pt of [facility] presenting for onset of regular contractions and hx of rapid labor. She reports good fetal movement, denies LOF, vaginal bleeding, vaginal itching/burning, urinary symptoms, h/a, dizziness, n/v, or fever/chills.     Maternal Medical History:  Reason for admission: Contractions.  Nausea.  Contractions: Onset was less than 1 hour ago.   Frequency: regular.   Duration is approximately 1 minute.   Perceived severity is moderate.    Fetal activity: Perceived fetal activity is normal.   Last perceived fetal movement was within the past hour.    Prenatal complications: no prenatal complications Prenatal Complications - Diabetes: none.    OB History    Gravida Para Term Preterm AB TAB SAB Ectopic Multiple Living   5 4 3 1     1 5      Past Medical History  Diagnosis Date  . Medical history non-contributory    Past Surgical History  Procedure Laterality Date  . No past surgeries     Family History: family history is negative for Cancer, Diabetes, and Hypertension. Social History:  reports that she has never smoked. She has never used smokeless tobacco. She reports that she does not drink alcohol or use illicit drugs.   Prenatal Transfer Tool  Maternal Diabetes: No Genetic Screening: Declined Maternal Ultrasounds/Referrals: Normal Fetal Ultrasounds or other Referrals:  None Maternal Substance Abuse:  No Significant Maternal Medications:  None Significant Maternal Lab Results:  Lab values include: Group B Strep positive Other Comments:  None  Review of Systems  Constitutional: Negative for fever, chills and malaise/fatigue.  Eyes: Negative for blurred vision.  Respiratory: Negative for cough and shortness of breath.   Cardiovascular: Negative for chest pain.  Gastrointestinal: Positive for abdominal pain. Negative for heartburn, nausea and vomiting.  Genitourinary: Negative for dysuria, urgency and frequency.   Musculoskeletal: Negative.   Neurological: Negative for dizziness and headaches.  Psychiatric/Behavioral: Negative for depression.    Dilation: 5.5 Effacement (%): 70 Station: -2 Exam by:: [email or web address],rnc Blood pressure 134/83, pulse 80, temperature 99.2 F (37.3 C), temperature source Oral, resp. rate 18, height 5\' 6"  (1.676 m), weight 206 lb (93.441 kg), last menstrual period 06/20/2014, unknown if currently breastfeeding. Maternal Exam:  Uterine Assessment: Contraction strength is moderate.  Contraction duration is 70 seconds. Contraction frequency is regular.   Abdomen: Fetal presentation: vertex  Cervix: Cervix evaluated by digital exam.     Fetal Exam Fetal Monitor Review: Mode: ultrasound.   Baseline rate: 135.  Variability: moderate (6-25 bpm).   Pattern: accelerations present and no decelerations.    Fetal State Assessment: Category I - tracings are normal.     Physical Exam  Nursing note and vitals reviewed. Constitutional: She is oriented to person, place, and time. She appears well-developed and well-nourished.  Neck: Normal range of motion.  Cardiovascular: Normal rate, regular rhythm and normal heart sounds.   Respiratory: Effort normal and breath sounds normal.  GI: Soft.  Musculoskeletal: Normal range of motion.  Neurological: She is alert and oriented to person, place, and time.  Skin: Skin is warm and dry.  Psychiatric: She has a normal mood and affect. Her behavior is normal. Judgment and thought content normal.    Prenatal labs: ABO, Rh: A/Positive/-- (12/06 0000) Antibody: Negative (12/06 0000) Rubella: Immune (12/06 0000) RPR: Nonreactive (12/06 0000)  HBsAg: Negative (12/06 0000)  HIV: Non-reactive, Non-reactive (12/06 0000)  GBS:   Positive.  Pt has paperwork from lab indicating GBS  positive results.  Assessment/Plan: [id number]   Active labor at term GBS positive  Admit to [facility] Expectant management Pt does not desire pain  medications Anticipate NSVD   [name], Elsy Chiang 03/25/2015, 1:37 PM

## 2015-03-26 LAB — RPR: RPR: NONREACTIVE

## 2015-03-26 LAB — TYPE AND SCREEN
ABO/RH(D): A POS
Antibody Screen: NEGATIVE

## 2015-03-26 LAB — ABO/RH: ABO/RH(D): A POS

## 2015-03-26 NOTE — Progress Notes (Addendum)
POSTPARTUM PROGRESS NOTE  Post Partum Day 1 Subjective:  Lauren Doyle is a 40 y.o. [id number] 6057w5d s/p SVD.  No acute events overnight.  Pt denies problems with ambulating, voiding or po intake.  She denies nausea or vomiting.  Pain is minimal and well controlled.  She has had flatus. Lochia minimal.  Objective: Blood pressure 124/73, pulse 90, temperature 98.2 F (36.8 C), temperature source Oral, resp. rate 18, height 5\' 6"  (1.676 m), weight 206 lb (93.441 kg), last menstrual period 06/20/2014, SpO2 99 %, unknown if currently breastfeeding.  Physical Exam:  General: alert, cooperative and no distress Lochia:normal flow Chest: CTAB Heart: RRR no m/r/g Abdomen: +BS, soft, nontender,  Uterine Fundus: firm, U+2 DVT Evaluation: No calf swelling or tenderness Extremities: no edema   Recent Labs  03/25/15 1345  HGB 8.7*  HCT 28.9*    Assessment/Plan:  ASSESSMENT: Lauren Doyle is a 40 y.o. [id number] 6057w5d s/p SVD.    -Continue routine postpartum.   #Breast feeding- continue.  Lactation c/s as needed.  #Pain control- tylenol, motrin, and percocet PRN.  #Diet normal.   Plan for discharge tomorrow   LOS: 1 day   Douglass Dunshee J [place] 03/26/2015, 7:06 PM

## 2015-03-26 NOTE — Lactation Note (Signed)
This note was copied from a baby's chart. Lactation Consultation Note  Mother denies questons or problems. Encouraged her to breastfeed before offering formula to establish her milk . Suggest she call if she needs assistance.  Patient Name: Lauren Doyle ZOXWR'UToday's Date: 03/26/2015 Reason for consult: Initial assessment   Maternal Data    Feeding Feeding Type: Bottle Fed - Formula Length of feed: 24 min  LATCH Score/Interventions                      Lactation Tools Discussed/Used     Consult Status Consult Status: PRN    Hardie PulleyBerkelhammer, Chavon Lucarelli Boschen 03/26/2015, 1:38 PM

## 2015-03-27 MED ORDER — ACETAMINOPHEN 325 MG PO TABS: 650.0000 mg | ORAL_TABLET | ORAL | Status: AC | PRN

## 2015-03-27 MED ORDER — IBUPROFEN 600 MG PO TABS: 600.0000 mg | ORAL_TABLET | Freq: Four times a day (QID) | ORAL | Status: DC

## 2015-03-27 NOTE — Progress Notes (Signed)
Interpreter [phone] used to go over discharge paperwork and instructions.  Pt denies questions at this time.

## 2015-03-27 NOTE — Discharge Summary (Signed)
OB Discharge Summary     Patient Name: Lauren Doyle DOB: 09/18/1975 MRN: [id number]  Date of admission: 03/25/2015 Delivering MD: Sharen CounterLEFTWICH-KIRBY, LISA A   Date of discharge: 03/27/2015  Admitting diagnosis: 40WKS,WATER BROKE Intrauterine pregnancy: 3657w5d     Secondary diagnosis:  Active Problems:   Active labor at term   Pregnancy   NSVD (normal spontaneous vaginal delivery)  Additional problems: None      Discharge diagnosis: Term Pregnancy Delivered                                                                                                Post partum procedures:None  Augmentation: none  Complications: None  Hospital course:  Onset of Labor With Vaginal Delivery     40 y.o. yo [id number] at 3857w5d was admitted in Active Labor on 03/25/2015. Patient had an uncomplicated labor course as follows:  Membrane Rupture Time/Date: 11:30 AM ,03/25/2015   Intrapartum Procedures: Episiotomy: None [1]                                         Lacerations:  None [1]  Patient had a delivery of a Viable infant. 03/25/2015  Information for the patient's newborn:  Lauren Doyle, Girl Lauren Doyle [[id number]][[id number]]  Delivery Method: Vaginal, Spontaneous Delivery (Filed from Delivery Summary)    Pateint had an uncomplicated postpartum course.  She is ambulating, tolerating a regular diet, passing flatus, and urinating well. Patient is discharged home in stable condition on 03/27/2015.    Physical exam  Filed Vitals:   03/25/15 2148 03/26/15 0530 03/26/15 1853 03/27/15 0541  BP: 127/70 115/72 124/73 110/72  Pulse: 97 86 90 83  Temp: 98.5 F (36.9 C) 98.2 F (36.8 C)  98.4 F (36.9 C)  TempSrc:    Oral  Resp: 20 20 18 18   Height:      Weight:      SpO2:    100%   General: alert Lochia: appropriate Uterine Fundus: firm Incision: N/A DVT Evaluation: No evidence of DVT seen on physical exam. Labs: Lab Results  Component Value Date   WBC 8.3 03/25/2015   HGB 8.7* 03/25/2015   HCT 28.9* 03/25/2015   MCV  69.1* 03/25/2015   PLT 262 03/25/2015   CMP Latest Ref Rng 04/07/2010  Glucose 70 - 99 mg/dL 78  BUN 6 - 23 mg/dL 7  Creatinine 0.4 - 1.2 mg/dL 1.300.46  Sodium 865135 - 784145 mEq/L 136  Potassium 3.5 - 5.1 mEq/L 4.2  Chloride 96 - 112 mEq/L 106  CO2 19 - 32 mEq/L 23  Calcium 8.4 - 10.5 mg/dL 8.4  Total Protein 6.0 - 8.3 g/dL 6.9(G5.7(L)  Total Bilirubin 0.3 - 1.2 mg/dL 0.4  Alkaline Phos 39 - 117 U/L 112  AST 0 - 37 U/L 15  ALT 0 - 35 U/L 8    Discharge instruction: per After Visit Summary and "Baby and Me Booklet".  After visit meds:    Medication List    TAKE these medications  acetaminophen 325 MG tablet  Commonly known as:  TYLENOL  Take 2 tablets (650 mg total) by mouth every 4 (four) hours as needed (for pain scale < 4).     ibuprofen 600 MG tablet  Commonly known as:  ADVIL,MOTRIN  Take 1 tablet (600 mg total) by mouth every 6 (six) hours.      ASK your doctor about these medications        PRENATAL VITAMIN PO  Take by mouth.        Diet: routine diet  Activity: Advance as tolerated. Pelvic rest for 6 weeks.   Outpatient follow up:6 weeks Follow up Appt:Future Appointments Date Time Provider Department Center  05/07/2015 1:00 PM Judeth Horn, NP [facility]   Follow up Visit:No Follow-up on file.  Postpartum contraception: Patient is interested in IUD contraception.  Newborn Data: Live born female  Birth Weight: 8 lb (3629 g) APGAR: 7, 9  Baby Feeding: Breast Disposition:home with mother   03/27/2015 Michael J [place], MD   OB FELLOW DISCHARGE ATTESTATION  I have seen and examined this patient and agree with above documentation in the resident's note.   Silvano Bilis, MD 11:08 AM

## 2015-05-07 ENCOUNTER — Ambulatory Visit: Payer: Medicaid Other | Admitting: Student

## 2016-10-17 ENCOUNTER — Encounter (HOSPITAL_COMMUNITY): Payer: Self-pay

## 2016-10-17 ENCOUNTER — Ambulatory Visit (HOSPITAL_COMMUNITY)
Admission: EM | Admit: 2016-10-17 | Discharge: 2016-10-17 | Disposition: A | Discharge status: Home / Self Care | Payer: Self-pay | Attending: Emergency Medicine | Admitting: Emergency Medicine

## 2016-10-17 DIAGNOSIS — G479 Sleep disorder, unspecified: Secondary | ICD-10-CM

## 2016-10-17 DIAGNOSIS — Z3202 Encounter for pregnancy test, result negative: Secondary | ICD-10-CM

## 2016-10-17 DIAGNOSIS — R634 Abnormal weight loss: Secondary | ICD-10-CM

## 2016-10-17 DIAGNOSIS — R630 Anorexia: Secondary | ICD-10-CM

## 2016-10-17 DIAGNOSIS — R3129 Other microscopic hematuria: Secondary | ICD-10-CM

## 2016-10-17 LAB — POCT I-STAT, CHEM 8
BUN: 9 mg/dL (ref 6–20)
CALCIUM ION: 1.23 mmol/L (ref 1.15–1.40)
CREATININE: 0.6 mg/dL (ref 0.44–1.00)
Chloride: 104 mmol/L (ref 101–111)
Glucose, Bld: 119 mg/dL — ABNORMAL HIGH (ref 65–99)
HEMATOCRIT: 44 % (ref 36.0–46.0)
HEMOGLOBIN: 15 g/dL (ref 12.0–15.0)
Potassium: 3.6 mmol/L (ref 3.5–5.1)
SODIUM: 140 mmol/L (ref 135–145)
TCO2: 25 mmol/L (ref 22–32)

## 2016-10-17 LAB — POCT URINALYSIS DIP (DEVICE)
Bilirubin Urine: NEGATIVE
GLUCOSE, UA: NEGATIVE mg/dL
Ketones, ur: NEGATIVE mg/dL
Nitrite: NEGATIVE
PROTEIN: NEGATIVE mg/dL
Specific Gravity, Urine: 1.015 (ref 1.005–1.030)
Urobilinogen, UA: 0.2 mg/dL (ref 0.0–1.0)
pH: 7 (ref 5.0–8.0)

## 2016-10-17 LAB — POCT PREGNANCY, URINE: Preg Test, Ur: NEGATIVE

## 2016-10-17 NOTE — ED Provider Notes (Signed)
[facility]    CSN: [id number] Arrival date & time: 10/17/16  1602     History   Chief Complaint Chief Complaint  Patient presents with  . Anorexia    HPI Lauren Doyle is a 41 y.o. female.   41 year old Muslim female is accompanied by English-speaking significant other with a complaint of anorexia and weight loss over the past 2-3 months. She thinks she may have lost 18 pounds over the past 2-3 months. Her appetite has decreased. She states she works 5-6 days a week for about 12 hours. She states she is not sleeping well. Denies pain, fever, chills, sore throat, upper rotatory symptoms, chest pain, shortness of breath, abdominal pain, urinary symptoms. She denies feeling fatigued or especially tired. LMP 7 days ago.      Past Medical History:  Diagnosis Date  . Medical history non-contributory     Patient Active Problem List   Diagnosis Date Noted  . Active labor at term 03/25/2015  . Pregnancy 03/25/2015  . NSVD (normal spontaneous vaginal delivery) 03/25/2015    Past Surgical History:  Procedure Laterality Date  . NO PAST SURGERIES      OB History    Gravida Para Term Preterm AB Living   SAB TAB Ectopic Multiple Live Births         1 6       Home Medications    Prior to Admission medications   Medication Sig Start Date End Date Taking? Authorizing Provider  acetaminophen (TYLENOL) 325 MG tablet Take 2 tablets (650 mg total) by mouth every 4 (four) hours as needed (for pain scale < 4). 03/27/15  Yes [place], Michael J, MD  ibuprofen (ADVIL,MOTRIN) 600 MG tablet Take 1 tablet (600 mg total) by mouth every 6 (six) hours. 03/27/15  Yes [place], Michael J, MD  Prenatal Vit-Fe Fumarate-FA (PRENATAL VITAMIN PO) Take by mouth.   Yes [provider]    Family History Family History  Problem Relation Age of Onset  . Cancer Neg Hx   . Diabetes Neg Hx   . Hypertension Neg Hx     Social History Social History  Substance Use Topics    . Smoking status: Never Smoker  . Smokeless tobacco: Never Used  . Alcohol use No     Allergies   Patient has no known allergies.   Review of Systems Review of Systems  Constitutional: Negative for activity change, chills, diaphoresis, fatigue and fever.  HENT: Negative.   Respiratory: Negative.   Cardiovascular: Negative for chest pain, palpitations and leg swelling.  Gastrointestinal: Negative for abdominal pain and blood in stool.       Intermittent, occasional short episodes of nausea without vomiting.  Endocrine: Negative for polyphagia and polyuria.  Genitourinary: Negative.  Negative for dysuria, frequency, menstrual problem and vaginal discharge.  Musculoskeletal: Negative.   Skin: Negative.   Neurological: Negative.   All other systems reviewed and are negative.    Physical Exam Triage Vital Signs ED Triage Vitals [10/17/16 1639]  Enc Vitals Group     BP 129/86     Pulse Rate 74     Resp 16     Temp 97.8 F (36.6 C)     Temp Source Oral     SpO2 100 %     Weight      Height      Head Circumference      Peak Flow  Pain Score      Pain Loc      Pain Edu?      Excl. in GC?    No data found.   Updated Vital Signs BP 129/86 (BP Location: Left Arm)   Pulse 74   Temp 97.8 F (36.6 C) (Oral)   Resp 16   SpO2 100%   Visual Acuity Right Eye Distance:   Left Eye Distance:   Bilateral Distance:    Right Eye Near:   Left Eye Near:    Bilateral Near:     Physical Exam  Constitutional: She is oriented to person, place, and time. She appears well-developed and well-nourished. No distress.  HENT:  Mouth/Throat: Oropharynx is clear and moist. No oropharyngeal exudate.  Eyes: Pupils are equal, round, and reactive to light. EOM are normal.  Neck: Normal range of motion. Neck supple.  Cardiovascular: Normal rate, regular rhythm, normal heart sounds and intact distal pulses.   No murmur heard. Pulmonary/Chest: Effort normal and breath sounds normal.  No respiratory distress. She has no wheezes.  Abdominal: Soft. There is no tenderness. There is no guarding.  Musculoskeletal: Normal range of motion. She exhibits no edema.  Lymphadenopathy:    She has no cervical adenopathy.  Neurological: She is alert and oriented to person, place, and time.  Skin: Skin is warm and dry.  Psychiatric: She has a normal mood and affect.  Nursing note and vitals reviewed.    UC Treatments / Results  Labs (all labs ordered are listed, but only abnormal results are displayed) Labs Reviewed  POCT URINALYSIS DIP (DEVICE) - Abnormal; Notable for the following:       Result Value   Hgb urine dipstick LARGE (*)    Leukocytes, UA TRACE (*)    All other components within normal limits  POCT I-STAT, CHEM 8 - Abnormal; Notable for the following:    Glucose, Bld 119 (*)    All other components within normal limits  POCT PREGNANCY, URINE    EKG  EKG Interpretation None       Radiology No results found.  Procedures Procedures (including critical care time)  Medications Ordered in UC Medications - No data to display   Initial Impression / Assessment and Plan / UC Course  I have reviewed the triage vital signs and the nursing notes.  Pertinent labs & imaging results that were available during my care of the patient were reviewed by me and considered in my medical decision making (see chart for details).    Read the information regarding your symptoms. Your blood sugar is normal at 119. You do have some microscopic blood in your urine. This may become caused by residual menstrual flow. No other abnormalities are found that explain your weight loss or loss of appetite. Follow-up with a primary care provider such as community health and wellness above. Call for an appointment. You may also use Google to assist in finding a local primary care provider.    Final Clinical Impressions(s) / UC Diagnoses   Final diagnoses:  Anorexia  Loss of weight    Sleep disturbance  Microscopic hematuria    New Prescriptions New Prescriptions   No medications on file     Controlled Substance Prescriptions [place] Controlled Substance Registry consulted? Not Applicable   Hayden Rasmussen, NP 10/17/16 Paulo Fruit

## 2016-10-17 NOTE — ED Triage Notes (Signed)
Patient presents to [facility] with complaints of loss of appetite and weight loss, pt denies any pain

## 2016-10-17 NOTE — Discharge Instructions (Signed)
Read the information regarding your symptoms. Your blood sugar is normal at 119. You do have some microscopic blood in your urine. This may become caused by residual menstrual flow. No other abnormalities are found that explain your weight loss or loss of appetite. Follow-up with a primary care provider such as community health and wellness above. Call for an appointment. You may also use Google to assist in finding a local primary care provider.

## 2016-10-21 ENCOUNTER — Encounter (HOSPITAL_COMMUNITY): Payer: Self-pay

## 2016-10-21 ENCOUNTER — Emergency Department (HOSPITAL_COMMUNITY)
Admission: EM | Admit: 2016-10-21 | Discharge: 2016-10-21 | Disposition: A | Discharge status: Home / Self Care | Payer: Self-pay | Attending: Emergency Medicine | Admitting: Emergency Medicine

## 2016-10-21 DIAGNOSIS — Z79899 Other long term (current) drug therapy: Secondary | ICD-10-CM | POA: Insufficient documentation

## 2016-10-21 DIAGNOSIS — K219 Gastro-esophageal reflux disease without esophagitis: Secondary | ICD-10-CM | POA: Insufficient documentation

## 2016-10-21 DIAGNOSIS — R319 Hematuria, unspecified: Secondary | ICD-10-CM | POA: Insufficient documentation

## 2016-10-21 DIAGNOSIS — R634 Abnormal weight loss: Secondary | ICD-10-CM | POA: Insufficient documentation

## 2016-10-21 LAB — COMPREHENSIVE METABOLIC PANEL
ALK PHOS: 74 U/L (ref 38–126)
ALT: 22 U/L (ref 14–54)
AST: 25 U/L (ref 15–41)
Albumin: 3.8 g/dL (ref 3.5–5.0)
Anion gap: 8 (ref 5–15)
BUN: 11 mg/dL (ref 6–20)
CALCIUM: 9.3 mg/dL (ref 8.9–10.3)
CHLORIDE: 104 mmol/L (ref 101–111)
CO2: 23 mmol/L (ref 22–32)
CREATININE: 0.67 mg/dL (ref 0.44–1.00)
Glucose, Bld: 121 mg/dL — ABNORMAL HIGH (ref 65–99)
Potassium: 3.9 mmol/L (ref 3.5–5.1)
SODIUM: 135 mmol/L (ref 135–145)
Total Bilirubin: 0.6 mg/dL (ref 0.3–1.2)
Total Protein: 7.8 g/dL (ref 6.5–8.1)

## 2016-10-21 LAB — URINALYSIS, ROUTINE W REFLEX MICROSCOPIC
Bilirubin Urine: NEGATIVE
Glucose, UA: NEGATIVE mg/dL
Ketones, ur: NEGATIVE mg/dL
LEUKOCYTES UA: NEGATIVE
Nitrite: NEGATIVE
PH: 5 (ref 5.0–8.0)
Protein, ur: 30 mg/dL — AB
Specific Gravity, Urine: 1.029 (ref 1.005–1.030)

## 2016-10-21 LAB — CBC
HCT: 42.5 % (ref 36.0–46.0)
Hemoglobin: 13.5 g/dL (ref 12.0–15.0)
MCH: 25.9 pg — AB (ref 26.0–34.0)
MCHC: 31.8 g/dL (ref 30.0–36.0)
MCV: 81.6 fL (ref 78.0–100.0)
PLATELETS: 279 10*3/uL (ref 150–400)
RBC: 5.21 MIL/uL — AB (ref 3.87–5.11)
RDW: 12.7 % (ref 11.5–15.5)
WBC: 8.4 10*3/uL (ref 4.0–10.5)

## 2016-10-21 LAB — LIPASE, BLOOD: Lipase: 32 U/L (ref 11–51)

## 2016-10-21 MED ORDER — PROMETHAZINE HCL 25 MG PO TABS: 25.0000 mg | ORAL_TABLET | Freq: Four times a day (QID) | ORAL | 0 refills | Status: DC | PRN

## 2016-10-21 MED ORDER — PANTOPRAZOLE SODIUM 20 MG PO TBEC: 20.0000 mg | DELAYED_RELEASE_TABLET | Freq: Two times a day (BID) | ORAL | 0 refills | Status: DC

## 2016-10-21 MED ORDER — GI COCKTAIL ~~LOC~~
30.0000 mL | Freq: Once | ORAL | Status: AC
  Administered 2016-10-21: 30 mL via ORAL
  Filled 2016-10-21: qty 30

## 2016-10-21 MED ORDER — FAMOTIDINE 20 MG PO TABS
20.0000 mg | ORAL_TABLET | Freq: Once | ORAL | Status: AC
  Administered 2016-10-21: 20 mg via ORAL
  Filled 2016-10-21: qty 1

## 2016-10-21 NOTE — ED Provider Notes (Signed)
[facility]   CSN: [id number] Arrival date & time: 10/21/16  0841     History   Chief Complaint Chief Complaint  Patient presents with  . Emesis    HPI   Blood pressure (!) 137/91, pulse 86, temperature 98.2 F (36.8 C), temperature source Oral, resp. rate 17, last menstrual period 10/08/2016, SpO2 100 %, unknown if currently breastfeeding.  Lauren Doyle is a 41 y.o. female complaining of Sour brash in her mouth for several weeks she has intermittent emesis is nonbloody, nonbilious, non-coffee ground. She states that her appetite has decreased and that she's lost 17 pounds in 3 months without trying to lose weight. She denies any night sweats, easy bruising or bleeding, abdominal pain, dysuria, urinary frequency, change in defecation, fever, chills, sick contacts.  Communication via Print production planner   Past Medical History:  Diagnosis Date  . Medical history non-contributory     Patient Active Problem List   Diagnosis Date Noted  . Active labor at term 03/25/2015  . Pregnancy 03/25/2015  . NSVD (normal spontaneous vaginal delivery) 03/25/2015    Past Surgical History:  Procedure Laterality Date  . NO PAST SURGERIES      OB History    Gravida Para Term Preterm AB Living   SAB TAB Ectopic Multiple Live Births         1 6       Home Medications    Prior to Admission medications   Medication Sig Start Date End Date Taking? Authorizing Provider  acetaminophen (TYLENOL) 325 MG tablet Take 2 tablets (650 mg total) by mouth every 4 (four) hours as needed (for pain scale < 4). 03/27/15   [place], Michael J, MD  ibuprofen (ADVIL,MOTRIN) 600 MG tablet Take 1 tablet (600 mg total) by mouth every 6 (six) hours. 03/27/15   [place], Michael J, MD  pantoprazole (PROTONIX) 20 MG tablet Take 1 tablet (20 mg total) by mouth 2 (two) times daily before a meal. 10/21/16   Kajah Santizo, Joni Reining, PA-C  Prenatal Vit-Fe Fumarate-FA (PRENATAL VITAMIN PO) Take by  mouth.    [provider]  promethazine (PHENERGAN) 25 MG tablet Take 1 tablet (25 mg total) by mouth every 6 (six) hours as needed for nausea or vomiting. 10/21/16   Yahye Siebert, Joni Reining, PA-C    Family History Family History  Problem Relation Age of Onset  . Cancer Neg Hx   . Diabetes Neg Hx   . Hypertension Neg Hx     Social History Social History  Substance Use Topics  . Smoking status: Never Smoker  . Smokeless tobacco: Never Used  . Alcohol use No     Allergies   Patient has no known allergies.   Review of Systems Review of Systems  A complete review of systems was obtained and all systems are negative except as noted in the HPI and PMH.   Physical Exam Updated Vital Signs BP (!) 137/91 (BP Location: Left Arm)   Pulse 86   Temp 98.2 F (36.8 C) (Oral)   Resp 17   LMP 10/08/2016   SpO2 100%   Physical Exam  Constitutional: She is oriented to person, place, and time. She appears well-developed and well-nourished. No distress.  HENT:  Head: Normocephalic and atraumatic.  Mouth/Throat: Oropharynx is clear and moist.  Eyes: Pupils are equal, round, and reactive to light. Conjunctivae and EOM are normal.  Neck: Normal range of motion.  Cardiovascular: Normal rate,  regular rhythm and intact distal pulses.   Pulmonary/Chest: Effort normal and breath sounds normal.  Abdominal: Soft. There is no tenderness.  Musculoskeletal: Normal range of motion.  Neurological: She is alert and oriented to person, place, and time.  Skin: She is not diaphoretic.  Psychiatric: She has a normal mood and affect.  Nursing note and vitals reviewed.    ED Treatments / Results  Labs (all labs ordered are listed, but only abnormal results are displayed) Labs Reviewed  COMPREHENSIVE METABOLIC PANEL - Abnormal; Notable for the following:       Result Value   Glucose, Bld 121 (*)    All other components within normal limits  CBC - Abnormal; Notable for the following:    RBC  5.21 (*)    MCH 25.9 (*)    All other components within normal limits  URINALYSIS, ROUTINE W REFLEX MICROSCOPIC - Abnormal; Notable for the following:    Color, Urine AMBER (*)    APPearance HAZY (*)    Hgb urine dipstick MODERATE (*)    Protein, ur 30 (*)    Bacteria, UA RARE (*)    Squamous Epithelial / LPF 0-5 (*)    All other components within normal limits  LIPASE, BLOOD    EKG  EKG Interpretation None       Radiology No results found.  Procedures Procedures (including critical care time)  Medications Ordered in ED Medications  gi cocktail (Maalox,Lidocaine,Donnatal) (30 mLs Oral Given 10/21/16 1052)  famotidine (PEPCID) tablet 20 mg (20 mg Oral Given 10/21/16 1052)     Initial Impression / Assessment and Plan / ED Course  I have reviewed the triage vital signs and the nursing notes.  Pertinent labs & imaging results that were available during my care of the patient were reviewed by me and considered in my medical decision making (see chart for details).     Vitals:   10/21/16 0847  BP: (!) 137/91  Pulse: 86  Resp: 17  Temp: 98.2 F (36.8 C)  TempSrc: Oral  SpO2: 100%    Medications  gi cocktail (Maalox,Lidocaine,Donnatal) (30 mLs Oral Given 10/21/16 1052)  famotidine (PEPCID) tablet 20 mg (20 mg Oral Given 10/21/16 1052)    Lauren Doyle is 41 y.o. female presenting with Unintentional weight loss, sour brash and several episodes of emesis. She denies any abdominal pain, melena, hematochezia. Abdominal exam is benign, vital signs reassuring, blood work with no significant abnormality urinalysis does show a hematuria and in looking at past year urinalysis is she's always had hematuria 5 Tesla over the course of 5 years. Given the persistence of this I will refer her to urology, she states that she has an appointment with her primary care doctor in 3 weeks. This is a new PCP. Will treat her for a gastritis with proton X and Phenergan for nausea. She does have an  unintentional weight loss but no other B symptoms. I'm concerned with the persistent hematuria that there may be a underlying malignancy. I have discussed this frankly with her and her son and discussed the importance of follow-up with them and they verbalized their understanding and teach back technique.  Evaluation does not show pathology that would require ongoing emergent intervention or inpatient treatment. Pt is hemodynamically stable and mentating appropriately. Discussed findings and plan with patient/guardian, who agrees with care plan. All questions answered. Return precautions discussed and outpatient follow up given.    Final Clinical Impressions(s) / ED Diagnoses   Final diagnoses:  Gastroesophageal reflux disease, esophagitis presence not specified  Unintentional weight loss  Hematuria, unspecified type    New Prescriptions New Prescriptions   PANTOPRAZOLE (PROTONIX) 20 MG TABLET    Take 1 tablet (20 mg total) by mouth 2 (two) times daily before a meal.   PROMETHAZINE (PHENERGAN) 25 MG TABLET    Take 1 tablet (25 mg total) by mouth every 6 (six) hours as needed for nausea or vomiting.     Kaylyn Lim 10/21/16 1119    Rolan Bucco, MD 10/21/16 1352

## 2016-10-21 NOTE — Discharge Instructions (Signed)
Please see her primary care doctor in 3 weeks, don't hesitate to return to the emergency room for any new or worsening symptoms.

## 2016-10-21 NOTE — ED Notes (Signed)
ED Provider at bedside. 

## 2016-10-21 NOTE — ED Triage Notes (Signed)
Pt presents to the ed with complaints of nausea, vomiting and decreased appetite x 1 week. Pt denies any pain and is in no distress in triage.

## 2016-11-03 ENCOUNTER — Emergency Department (HOSPITAL_COMMUNITY)
Admission: EM | Admit: 2016-11-03 | Discharge: 2016-11-03 | Disposition: A | Discharge status: Home / Self Care | Payer: Self-pay | Attending: Emergency Medicine | Admitting: Emergency Medicine

## 2016-11-03 ENCOUNTER — Emergency Department (HOSPITAL_COMMUNITY): Payer: Self-pay

## 2016-11-03 ENCOUNTER — Encounter (HOSPITAL_COMMUNITY): Payer: Self-pay | Admitting: *Deleted

## 2016-11-03 DIAGNOSIS — R634 Abnormal weight loss: Secondary | ICD-10-CM | POA: Insufficient documentation

## 2016-11-03 DIAGNOSIS — R079 Chest pain, unspecified: Secondary | ICD-10-CM | POA: Insufficient documentation

## 2016-11-03 DIAGNOSIS — Z79899 Other long term (current) drug therapy: Secondary | ICD-10-CM | POA: Insufficient documentation

## 2016-11-03 DIAGNOSIS — R630 Anorexia: Secondary | ICD-10-CM | POA: Insufficient documentation

## 2016-11-03 DIAGNOSIS — R319 Hematuria, unspecified: Secondary | ICD-10-CM | POA: Insufficient documentation

## 2016-11-03 DIAGNOSIS — R531 Weakness: Secondary | ICD-10-CM | POA: Insufficient documentation

## 2016-11-03 LAB — URINALYSIS, ROUTINE W REFLEX MICROSCOPIC
Bilirubin Urine: NEGATIVE
GLUCOSE, UA: NEGATIVE mg/dL
Hgb urine dipstick: NEGATIVE
KETONES UR: 20 mg/dL — AB
LEUKOCYTES UA: NEGATIVE
NITRITE: NEGATIVE
Protein, ur: NEGATIVE mg/dL
Specific Gravity, Urine: 1.014 (ref 1.005–1.030)
pH: 5 (ref 5.0–8.0)

## 2016-11-03 LAB — CBC
HCT: 43.3 % (ref 36.0–46.0)
HEMOGLOBIN: 14.5 g/dL (ref 12.0–15.0)
MCH: 27 pg (ref 26.0–34.0)
MCHC: 33.5 g/dL (ref 30.0–36.0)
MCV: 80.5 fL (ref 78.0–100.0)
Platelets: 244 10*3/uL (ref 150–400)
RBC: 5.38 MIL/uL — AB (ref 3.87–5.11)
RDW: 12.9 % (ref 11.5–15.5)
WBC: 8.6 10*3/uL (ref 4.0–10.5)

## 2016-11-03 LAB — BASIC METABOLIC PANEL
ANION GAP: 9 (ref 5–15)
BUN: 8 mg/dL (ref 6–20)
CHLORIDE: 105 mmol/L (ref 101–111)
CO2: 22 mmol/L (ref 22–32)
Calcium: 9.3 mg/dL (ref 8.9–10.3)
Creatinine, Ser: 0.58 mg/dL (ref 0.44–1.00)
Glucose, Bld: 84 mg/dL (ref 65–99)
POTASSIUM: 3.4 mmol/L — AB (ref 3.5–5.1)
SODIUM: 136 mmol/L (ref 135–145)

## 2016-11-03 LAB — PREGNANCY, URINE: PREG TEST UR: NEGATIVE

## 2016-11-03 LAB — I-STAT TROPONIN, ED: Troponin i, poc: 0 ng/mL (ref 0.00–0.08)

## 2016-11-03 NOTE — ED Notes (Signed)
Pt ambulatory to restroom with steady gait.

## 2016-11-03 NOTE — ED Notes (Signed)
Pt verbalizes understanding of d/c instructions. Pt ambulatory at d/c with all belongings.   

## 2016-11-03 NOTE — ED Triage Notes (Signed)
To ED for eval of chest pain that moves to right shoulder. States the pain is worse with palpation. Noticed bump to left foot and bil groin also. Not visible. Appears in nad. No vomiting. No diaphoresis.

## 2016-11-03 NOTE — ED Notes (Signed)
Pt to CT

## 2016-11-03 NOTE — ED Provider Notes (Signed)
[facility] EMERGENCY DEPARTMENT Provider Note   CSN: [id number] Arrival date & time: 11/03/16  1736     History   Chief Complaint Chief Complaint  Patient presents with  . Chest Pain  . groing swelling    HPI Lauren Doyle is a 41 y.o. female.  HPI Patient presents with chest pain. Several last couple days. On her right chest. Worse with movements. Thinks maybe her muscles. Has been at work where she has to move her arms. She also has had a few weeks off because of feeling weak. Has had some weight loss 2. Had a decreased appetite. No fevers or chills. Has been seen in the ER in the past and had some hematuria. Has not followed up as an outpatient yet. No abdominal pain. States she had a bump on her chest will come and go. Also a small lump left ankle. No cough. No difficulty breathing. Has not followed up with the primary care doctor. Past Medical History:  Diagnosis Date  . Medical history non-contributory     Patient Active Problem List   Diagnosis Date Noted  . Active labor at term 03/25/2015  . Pregnancy 03/25/2015  . NSVD (normal spontaneous vaginal delivery) 03/25/2015    Past Surgical History:  Procedure Laterality Date  . NO PAST SURGERIES      OB History    Gravida Para Term Preterm AB Living   SAB TAB Ectopic Multiple Live Births         1 6       Home Medications    Prior to Admission medications   Medication Sig Start Date End Date Taking? Authorizing Provider  ibuprofen (ADVIL,MOTRIN) 600 MG tablet Take 1 tablet (600 mg total) by mouth every 6 (six) hours. 03/27/15  Yes [place], Michael J, MD  Multiple Vitamin (MULTIVITAMIN) tablet Take 1 tablet by mouth daily. To help with appetite   Yes [provider]  pantoprazole (PROTONIX) 20 MG tablet Take 1 tablet (20 mg total) by mouth 2 (two) times daily before a meal. 10/21/16  Yes Pisciotta, Joni Reining, PA-C  Prenatal Vit-Fe Fumarate-FA (PRENATAL VITAMIN PO) Take by  mouth.   Yes [provider]  promethazine (PHENERGAN) 25 MG tablet Take 1 tablet (25 mg total) by mouth every 6 (six) hours as needed for nausea or vomiting. 10/21/16  Yes Pisciotta, Joni Reining, PA-C  acetaminophen (TYLENOL) 325 MG tablet Take 2 tablets (650 mg total) by mouth every 4 (four) hours as needed (for pain scale < 4). Patient not taking: Reported on 11/03/2016 03/27/15   [place], Michael J, MD    Family History Family History  Problem Relation Age of Onset  . Cancer Neg Hx   . Diabetes Neg Hx   . Hypertension Neg Hx     Social History Social History  Substance Use Topics  . Smoking status: Never Smoker  . Smokeless tobacco: Never Used  . Alcohol use No     Allergies   Patient has no known allergies.   Review of Systems Review of Systems  Constitutional: Positive for appetite change. Negative for chills, diaphoresis and fever.  HENT: Negative for congestion.   Respiratory: Negative for shortness of breath.   Cardiovascular: Positive for chest pain.  Gastrointestinal: Positive for nausea.  Genitourinary: Negative for flank pain and frequency.  Musculoskeletal: Negative for back pain.  Skin: Negative for pallor and rash.  Neurological: Negative for seizures and syncope.  Hematological:  Negative for adenopathy.  Psychiatric/Behavioral: Negative for confusion.     Physical Exam Updated Vital Signs BP 115/84   Pulse 73   Temp 98.3 F (36.8 C) (Oral)   Resp (!) 23   LMP 10/08/2016   SpO2 100%   Physical Exam  Constitutional: She appears well-developed.  HENT:  Head: Normocephalic.  Eyes: Pupils are equal, round, and reactive to light.  Neck: Neck supple.  Cardiovascular: Normal rate.   Pulmonary/Chest: Effort normal. She exhibits no tenderness.  Abdominal: Soft. There is no tenderness.  Musculoskeletal: She exhibits no edema.  Neurological: She is alert.  Skin: Skin is warm. Capillary refill takes less than 2 seconds.  Small subcutaneous nodule  on left ankle.     ED Treatments / Results  Labs (all labs ordered are listed, but only abnormal results are displayed) Labs Reviewed  BASIC METABOLIC PANEL - Abnormal; Notable for the following:       Result Value   Potassium 3.4 (*)    All other components within normal limits  CBC - Abnormal; Notable for the following:    RBC 5.38 (*)    All other components within normal limits  URINALYSIS, ROUTINE W REFLEX MICROSCOPIC - Abnormal; Notable for the following:    Ketones, ur 20 (*)    All other components within normal limits  PREGNANCY, URINE  I-STAT TROPONIN, ED    EKG  EKG Interpretation  Date/Time:  Tuesday November 03 2016 18:16:37 EDT Ventricular Rate:  82 PR Interval:  170 QRS Duration: 80 QT Interval:  368 QTC Calculation: 429 R Axis:   23 Text Interpretation:  Normal sinus rhythm with sinus arrhythmia Normal ECG Confirmed by Benjiman Core [phone]) on 11/03/2016 9:36:29 PM       Radiology Dg Chest 2 View  Result Date: 11/03/2016 CLINICAL DATA:  Chest pain EXAM: CHEST  2 VIEW COMPARISON:  None. FINDINGS: Lungs are clear. Heart size and pulmonary vascularity are normal. No adenopathy. No pneumothorax. No bone lesions. IMPRESSION: No edema or consolidation. Electronically Signed   By: Bretta Bang III M.D.   On: 11/03/2016 19:09   Ct Renal Stone Study  Result Date: 11/03/2016 CLINICAL DATA:  Chest pain moving to the right shoulder, worse with palpation. EXAM: CT ABDOMEN AND PELVIS WITHOUT CONTRAST TECHNIQUE: Multidetector CT imaging of the abdomen and pelvis was performed following the standard protocol without IV contrast. COMPARISON:  None. FINDINGS: Examination is technically limited due to motion artifact. Lower chest: Lung bases appear clear. Hepatobiliary: No focal liver abnormality is seen. No gallstones, gallbladder wall thickening, or biliary dilatation. Pancreas: Unremarkable. No pancreatic ductal dilatation or surrounding inflammatory changes.  Spleen: Normal in size without focal abnormality. Adrenals/Urinary Tract: Adrenal glands are unremarkable. Kidneys are normal, without renal calculi, focal lesion, or hydronephrosis. Bladder is unremarkable. Stomach/Bowel: Stomach is within normal limits. Appendix appears normal. No evidence of bowel wall thickening, distention, or inflammatory changes. Vascular/Lymphatic: No significant vascular findings are present. No enlarged abdominal or pelvic lymph nodes. Reproductive: Uterus and ovaries are not enlarged. An intrauterine device is present centrally within the uterus. Other: No abdominal wall hernia or abnormality. No abdominopelvic ascites. Musculoskeletal: No acute or significant osseous findings. IMPRESSION: Examination is technically limited due to motion artifact. No acute process is suggested. No evidence of bowel obstruction or inflammation. An intrauterine device is present. Electronically Signed   By: Burman Nieves M.D.   On: 11/03/2016 23:06    Procedures Procedures (including critical care time)  Medications Ordered in ED Medications -  No data to display   Initial Impression / Assessment and Plan / ED Course  I have reviewed the triage vital signs and the nursing notes.  Pertinent labs & imaging results that were available during my care of the patient were reviewed by me and considered in my medical decision making (see chart for details).     Patient with right-sided chest pain. Benign exam. EKG reassuring. X-ray reassuring. Has had some hematuria in the past but not today. CT scan done due to the hematuria and since she has not followed up. With some technical limitations it was reassuring. Has been seen by care management and given resources for follow-up. Will discharge home. Print production planner used.  Final Clinical Impressions(s) / ED Diagnoses   Final diagnoses:  Nonspecific chest pain  Hematuria, unspecified type    New Prescriptions Discharge Medication List as  of 11/03/2016 11:19 PM       Benjiman Core, MD 11/03/16 2328

## 2017-03-21 ENCOUNTER — Other Ambulatory Visit: Payer: Self-pay

## 2017-03-21 ENCOUNTER — Encounter (HOSPITAL_COMMUNITY): Payer: Self-pay | Admitting: *Deleted

## 2017-03-21 ENCOUNTER — Emergency Department (HOSPITAL_COMMUNITY)
Admission: EM | Admit: 2017-03-21 | Discharge: 2017-03-21 | Disposition: A | Discharge status: Home / Self Care | Payer: Self-pay | Attending: Emergency Medicine | Admitting: Emergency Medicine

## 2017-03-21 DIAGNOSIS — R519 Headache, unspecified: Secondary | ICD-10-CM

## 2017-03-21 DIAGNOSIS — Z79899 Other long term (current) drug therapy: Secondary | ICD-10-CM | POA: Insufficient documentation

## 2017-03-21 DIAGNOSIS — R51 Headache: Secondary | ICD-10-CM

## 2017-03-21 DIAGNOSIS — K219 Gastro-esophageal reflux disease without esophagitis: Secondary | ICD-10-CM | POA: Insufficient documentation

## 2017-03-21 MED ORDER — OMEPRAZOLE 20 MG PO CPDR: 20.0000 mg | DELAYED_RELEASE_CAPSULE | Freq: Two times a day (BID) | ORAL | 0 refills | Status: DC

## 2017-03-21 MED ORDER — RANITIDINE HCL 150 MG PO CAPS: 150.0000 mg | ORAL_CAPSULE | Freq: Two times a day (BID) | ORAL | 0 refills | Status: DC

## 2017-03-21 MED ORDER — BUTALBITAL-APAP-CAFFEINE 50-325-40 MG PO TABS: 1.0000 | ORAL_TABLET | Freq: Four times a day (QID) | ORAL | 0 refills | Status: AC | PRN

## 2017-03-21 NOTE — ED Triage Notes (Signed)
Used interpretor line, pt reports having nausea and bitter taste in her mouth x 1 month. Now has headache since yesterday with bilateral leg/knee swelling and left hip pain. No acute distress is noted.

## 2017-03-21 NOTE — ED Provider Notes (Signed)
[facility] EMERGENCY DEPARTMENT Provider Note   CSN: [id number] Arrival date & time: 03/21/17  1104     History   Chief Complaint Chief Complaint  Patient presents with  . Headache    HPI Lauren Doyle is a 42 y.o. female.  HPI   42 year old [place] female who speaks Arabic presenting with multiple complaints.  History obtained through language interpreter.  Patient report for the past month she has noticed abnormal but occasional numb mouth.  Her symptoms have a gradual onset, persistent, with occasional sternal chest discomfort.  She noticed worsening sensation after eating but denies any significant abdominal pain.  No report of nausea vomiting or diarrhea.  She denies eating spicy foods, tomato-based food, chocolate, alcohol, or meds.  She denies eating late.  She report being seen for this problem before, was given some medicine but it did not help.  States that she took the medication after meal and took it for approximately 1 week.  Furthermore, patient report having recurrent headache.  Headache is described as a temporal headache gradual in onset, mild to moderate in severity, and sometimes lasting for whole entire day.  Headache is mild currently.  She has had similar headache within the past month.  Patient also complaining of swelling to her left hip and bilateral knees.  The swelling has been ongoing for the past several weeks without any significant pain.  She would like to know if it correlates with the bitter  taste in the mouth.  She admits to taking ibuprofen on occasion for her headache but denies chronic NSAID use.  She does not have any other medical problems.  She does not have a primary care provider.  Past Medical History:  Diagnosis Date  . Medical history non-contributory     Patient Active Problem List   Diagnosis Date Noted  . Active labor at term 03/25/2015  . Pregnancy 03/25/2015  . NSVD (normal spontaneous vaginal delivery) 03/25/2015     Past Surgical History:  Procedure Laterality Date  . NO PAST SURGERIES      OB History    Gravida Para Term Preterm AB Living   5 5 4 1   6    SAB TAB Ectopic Multiple Live Births         1 6       Home Medications    Prior to Admission medications   Medication Sig Start Date End Date Taking? Authorizing Provider  acetaminophen (TYLENOL) 325 MG tablet Take 2 tablets (650 mg total) by mouth every 4 (four) hours as needed (for pain scale < 4). Patient not taking: Reported on 11/03/2016 03/27/15   E[place], Michael J, MD  ibuprofen (ADVIL,MOTRIN) 600 MG tablet Take 1 tablet (600 mg total) by mouth every 6 (six) hours. 03/27/15   [place], Michael J, MD  Multiple Vitamin (MULTIVITAMIN) tablet Take 1 tablet by mouth daily. To help with appetite    [provider]  pantoprazole (PROTONIX) 20 MG tablet Take 1 tablet (20 mg total) by mouth 2 (two) times daily before a meal. 10/21/16   Pisciotta, Joni Reining, PA-C  Prenatal Vit-Fe Fumarate-FA (PRENATAL VITAMIN PO) Take by mouth.    [provider]  promethazine (PHENERGAN) 25 MG tablet Take 1 tablet (25 mg total) by mouth every 6 (six) hours as needed for nausea or vomiting. 10/21/16   Pisciotta, Joni Reining, PA-C    Family History Family History  Problem Relation Age of Onset  . Cancer Neg Hx   .  Diabetes Neg Hx   . Hypertension Neg Hx     Social History Social History   Tobacco Use  . Smoking status: Never Smoker  . Smokeless tobacco: Never Used  Substance Use Topics  . Alcohol use: No  . Drug use: No     Allergies   Patient has no known allergies.   Review of Systems Review of Systems  All other systems reviewed and are negative.    Physical Exam Updated Vital Signs BP (!) 143/93 (BP Location: Right Arm)   Pulse 72   Temp 98.5 F (36.9 C) (Oral)   Resp 18   SpO2 100%   Physical Exam  Constitutional: She is oriented to person, place, and time. She appears well-developed and well-nourished. No distress.   HENT:  Head: Normocephalic and atraumatic.  Mouth/Throat: Oropharynx is clear and moist.  Eyes: Conjunctivae are normal.  Neck: Normal range of motion. Neck supple. No neck rigidity.  Cardiovascular: Normal rate, regular rhythm and intact distal pulses.  Pulmonary/Chest: Effort normal and breath sounds normal.  Abdominal: Soft. There is no tenderness.  Musculoskeletal:  Examination of the skin on the left hip, and bilateral knee without any concerning changes, no swelling, erythema, edema, or warmth.  No deformity.  Hips and knees with full range of motion.  Neurological: She is alert and oriented to person, place, and time. She has normal strength. GCS eye subscore is 4. GCS verbal subscore is 5. GCS motor subscore is 6.  Skin: Skin is warm. No rash noted.  Psychiatric: She has a normal mood and affect.  Nursing note and vitals reviewed.    ED Treatments / Results  Labs (all labs ordered are listed, but only abnormal results are displayed) Labs Reviewed - No data to display  EKG  EKG Interpretation None       Radiology No results found.  Procedures Procedures (including critical care time)  Medications Ordered in ED Medications - No data to display   Initial Impression / Assessment and Plan / ED Course  I have reviewed the triage vital signs and the nursing notes.  Pertinent labs & imaging results that were available during my care of the patient were reviewed by me and considered in my medical decision making (see chart for details).     BP (!) 143/93 (BP Location: Right Arm)   Pulse 72   Temp 98.5 F (36.9 C) (Oral)   Resp 18   SpO2 100%    Final Clinical Impressions(s) / ED Diagnoses   Final diagnoses:  Gastroesophageal reflux disease, esophagitis presence not specified  Recurrent headache    ED Discharge Orders        Ordered    omeprazole (PRILOSEC) 20 MG capsule  2 times daily before meals     03/21/17 1246    ranitidine (ZANTAC) 150 MG  capsule  2 times daily     03/21/17 1246    butalbital-acetaminophen-caffeine (FIORICET, ESGIC) 50-325-40 MG tablet  Every 6 hours PRN     03/21/17 1246     12:42 PM Patient here with multiple complaints that her primary complaint is the bitter taste in her mouth.  Symptoms suggestive of reflux.  She is well-appearing, in no acute discomfort and mouth examination is unremarkable.  Plan to prescribe a PPI, and H2 blocker and recommend patient to take it 30 minutes before each major meals for at least 3 weeks.  I also encourage patient to find a primary care provider for further care.  Patient report headache.  No red flags.  No focal neuro deficit concerning for stroke or space-occupying lesion, no acute onset thunderclap headache concerning for subarachnoid hemorrhage, no fever or nuchal rigidity concerning for meningitis.  Will prescribe Fioricet to use as needed but recommend taking Tylenol as first option  Patient also complaining of swelling to her left hip and bilateral knees.  No injuries were noted and the skin examination is unremarkable.  Encourage patient to find a primary care provider for further evaluation of her condition.  Return precautions discussed.   Fayrene Helperran, Mehtaab Mayeda, PA-C 03/21/17 1248    Rolland PorterJames, Mark, MD 03/22/17 1140

## 2017-03-21 NOTE — Discharge Instructions (Signed)
Please take Zantac and Prilosec 30 minutes before each major meal for the next month.  Take fioricet as needed for headache.  Find  a primary care provider for further management of your health.

## 2018-05-09 LAB — OB RESULTS CONSOLE HEPATITIS B SURFACE ANTIGEN: Hepatitis B Surface Ag: NEGATIVE

## 2018-05-09 LAB — HEPATITIS C ANTIBODY: Hepatitis C Ab: NEGATIVE

## 2018-05-09 LAB — DRUG SCREEN, URINE: Drug Screen, Urine: NEGATIVE

## 2018-05-09 LAB — OB RESULTS CONSOLE RPR: RPR: NONREACTIVE

## 2018-05-09 LAB — OB RESULTS CONSOLE GC/CHLAMYDIA
Chlamydia: NEGATIVE
Gonorrhea: NEGATIVE

## 2018-05-09 LAB — OB RESULTS CONSOLE HIV ANTIBODY (ROUTINE TESTING): HIV: NONREACTIVE

## 2018-05-09 LAB — GLUCOSE, 1 HOUR: Glucose 1 Hour: 121

## 2018-05-09 LAB — OB RESULTS CONSOLE ANTIBODY SCREEN: Antibody Screen: NEGATIVE

## 2018-05-09 LAB — CYTOLOGY - PAP: Pap: NEGATIVE

## 2018-05-09 LAB — OB RESULTS CONSOLE ABO/RH: RH Type: POSITIVE

## 2018-05-09 LAB — CULTURE, OB URINE

## 2018-05-09 LAB — OB RESULTS CONSOLE RUBELLA ANTIBODY, IGM: Rubella: IMMUNE

## 2018-05-13 ENCOUNTER — Inpatient Hospital Stay (HOSPITAL_COMMUNITY)
Admission: AD | Admit: 2018-05-13 | Discharge: 2018-05-13 | Disposition: A | Discharge status: Home / Self Care | Payer: BLUE CROSS/BLUE SHIELD | Attending: Obstetrics & Gynecology | Admitting: Obstetrics & Gynecology

## 2018-05-13 ENCOUNTER — Inpatient Hospital Stay (HOSPITAL_COMMUNITY): Payer: Self-pay

## 2018-05-13 ENCOUNTER — Encounter (HOSPITAL_COMMUNITY): Payer: Self-pay | Admitting: *Deleted

## 2018-05-13 DIAGNOSIS — O26891 Other specified pregnancy related conditions, first trimester: Secondary | ICD-10-CM | POA: Diagnosis not present

## 2018-05-13 DIAGNOSIS — O200 Threatened abortion: Secondary | ICD-10-CM | POA: Diagnosis not present

## 2018-05-13 DIAGNOSIS — Z3A10 10 weeks gestation of pregnancy: Secondary | ICD-10-CM | POA: Insufficient documentation

## 2018-05-13 DIAGNOSIS — O9989 Other specified diseases and conditions complicating pregnancy, childbirth and the puerperium: Secondary | ICD-10-CM | POA: Insufficient documentation

## 2018-05-13 DIAGNOSIS — Z3491 Encounter for supervision of normal pregnancy, unspecified, first trimester: Secondary | ICD-10-CM | POA: Diagnosis not present

## 2018-05-13 DIAGNOSIS — O4691 Antepartum hemorrhage, unspecified, first trimester: Secondary | ICD-10-CM

## 2018-05-13 DIAGNOSIS — O26899 Other specified pregnancy related conditions, unspecified trimester: Secondary | ICD-10-CM

## 2018-05-13 DIAGNOSIS — Z79899 Other long term (current) drug therapy: Secondary | ICD-10-CM | POA: Insufficient documentation

## 2018-05-13 DIAGNOSIS — Z3A01 Less than 8 weeks gestation of pregnancy: Secondary | ICD-10-CM

## 2018-05-13 DIAGNOSIS — R109 Unspecified abdominal pain: Secondary | ICD-10-CM | POA: Insufficient documentation

## 2018-05-13 DIAGNOSIS — O4690 Antepartum hemorrhage, unspecified, unspecified trimester: Secondary | ICD-10-CM

## 2018-05-13 LAB — WET PREP, GENITAL
Clue Cells Wet Prep HPF POC: NONE SEEN
Sperm: NONE SEEN
Trich, Wet Prep: NONE SEEN
Yeast Wet Prep HPF POC: NONE SEEN

## 2018-05-13 LAB — URINALYSIS, ROUTINE W REFLEX MICROSCOPIC
Bilirubin Urine: NEGATIVE
Glucose, UA: NEGATIVE mg/dL
Ketones, ur: NEGATIVE mg/dL
Nitrite: NEGATIVE
Protein, ur: 30 mg/dL — AB
RBC / HPF: >50 RBC/hpf — ABNORMAL HIGH (ref 0–5)
Specific Gravity, Urine: 1.023 (ref 1.005–1.030)
pH: 6 (ref 5.0–8.0)

## 2018-05-13 LAB — CBC
HCT: 36.9 % (ref 36.0–46.0)
Hemoglobin: 12.1 g/dL (ref 12.0–15.0)
MCH: 26.4 pg (ref 26.0–34.0)
MCHC: 32.8 g/dL (ref 30.0–36.0)
MCV: 80.4 fL (ref 80.0–100.0)
Platelets: 249 10*3/uL (ref 150–400)
RBC: 4.59 MIL/uL (ref 3.87–5.11)
RDW: 13 % (ref 11.5–15.5)
WBC: 8.9 10*3/uL (ref 4.0–10.5)
nRBC: 0 % (ref 0.0–0.2)

## 2018-05-13 LAB — POCT PREGNANCY, URINE: Preg Test, Ur: POSITIVE — AB

## 2018-05-13 NOTE — MAU Provider Note (Signed)
History     CSN: [id number]  Arrival date and time: 05/13/18 2137   First Provider Initiated Contact with Patient 05/13/18 2251      Chief Complaint  Patient presents with  . Abdominal Pain  . Vaginal Bleeding   Lauren Doyle is a 43 y.o. G6P6 at 6171w4d by LMP who presents to MAU with complaints of abdominal pain and vaginal bleeding. She reports symptoms started occurring today around 1400. She reports lower abdominal cramping that is intermittent, rates pain 2/10- has not taken any medication for abdominal pain. She reports vaginal bleeding that start at the same time, initially started as brown spotting then has gotten heavier and is now dark red vaginal bleeding. She reports having to wear a pad for vaginal bleeding, denies having to change pad.    OB History    Gravida  6   Para  5   Term  4   Preterm  1   AB      Living  6     SAB      TAB      Ectopic      Multiple  1   Live Births  6           Past Medical History:  Diagnosis Date  . Medical history non-contributory     Past Surgical History:  Procedure Laterality Date  . NO PAST SURGERIES      Family History  Problem Relation Age of Onset  . Cancer Neg Hx   . Diabetes Neg Hx   . Hypertension Neg Hx     Social History   Tobacco Use  . Smoking status: Never Smoker  . Smokeless tobacco: Never Used  Substance Use Topics  . Alcohol use: No  . Drug use: No    Allergies: No Known Allergies  Medications Prior to Admission  Medication Sig Dispense Refill Last Dose  . Pyridoxine HCl (B-6 PO) Take by mouth.   05/12/2018 at Unknown time  . acetaminophen (TYLENOL) 325 MG tablet Take 2 tablets (650 mg total) by mouth every 4 (four) hours as needed (for pain scale < 4). (Patient not taking: Reported on 11/03/2016) 90 tablet 3 Not Taking at prn  . Multiple Vitamin (MULTIVITAMIN) tablet Take 1 tablet by mouth daily. To help with appetite   11/02/2016 at Unknown time  . omeprazole (PRILOSEC) 20 MG  capsule Take 1 capsule (20 mg total) by mouth 2 (two) times daily before a meal. 60 capsule 0   . Prenatal Vit-Fe Fumarate-FA (PRENATAL VITAMIN PO) Take by mouth.   Past Month at Unknown time  . promethazine (PHENERGAN) 25 MG tablet Take 1 tablet (25 mg total) by mouth every 6 (six) hours as needed for nausea or vomiting. 30 tablet 0 Past Week at prn  . ranitidine (ZANTAC) 150 MG capsule Take 1 capsule (150 mg total) by mouth 2 (two) times daily. 60 capsule 0     Review of Systems  Constitutional: Negative.   Respiratory: Negative.   Cardiovascular: Negative.   Gastrointestinal: Positive for abdominal pain. Negative for constipation, diarrhea, nausea and vomiting.  Genitourinary: Positive for vaginal bleeding. Negative for difficulty urinating, dysuria, frequency, hematuria, pelvic pain and urgency.  Musculoskeletal: Negative.    Physical Exam   Blood pressure 129/78, pulse 75, temperature 98.3 F (36.8 C), temperature source Oral, resp. rate 16, weight 84.3 kg, last menstrual period 02/28/2018, unknown if currently breastfeeding.  Physical Exam  Nursing note and vitals reviewed. Constitutional: She appears  well-developed and well-nourished. No distress.  Cardiovascular: Normal rate, regular rhythm and normal heart sounds.  Respiratory: Effort normal and breath sounds normal. No respiratory distress. She has no wheezes. She has no rales.  GI: Soft. She exhibits no distension. There is no abdominal tenderness. There is no rebound and no guarding.  Genitourinary:    Vaginal bleeding present.  There is bleeding in the vagina.    Genitourinary Comments: Pelvic exam: Cervix pink, visually open, without lesion, moderate amount of dark red vaginal bleeding present with small clots (4 faux swabs used), active bleeding from cervical os, vaginal walls and external genitalia normal Bimanual exam: Cervix 1 externally (0.5cm internally) /long/high, firm, anterior, neg CMT, uterus nontender,  nonenlarged, adnexa without tenderness, enlargement, or mass   Musculoskeletal: Normal range of motion.        General: No edema.  Neurological: She is alert.  Psychiatric: She has a normal mood and affect. Her behavior is normal. Thought content normal.    MAU Course  Procedures  MDM Orders Placed This Encounter  Procedures  . Wet prep, genital  . US OB LESS THAN 14 WEEKS WITH OB TRANSVAGINAL  . Urinalysis, Routine w reflex microscopic  . CBC  . hCG, quantitative, pregnancy  . Pregnancy, urine POC   Labs and [place] reviewed:  Results for orders placed or performed during the hospital encounter of 05/13/18 (from the past 24 hour(s))  Wet prep, genital     Status: Abnormal   Collection Time: 05/13/18  9:57 PM  Result Value Ref Range   Yeast Wet Prep HPF POC NONE SEEN NONE SEEN   Trich, Wet Prep NONE SEEN NONE SEEN   Clue Cells Wet Prep HPF POC NONE SEEN NONE SEEN   WBC, Wet Prep HPF POC MODERATE (A) NONE SEEN   Sperm NONE SEEN   Urinalysis, Routine w reflex microscopic     Status: Abnormal   Collection Time: 05/13/18 10:07 PM  Result Value Ref Range   Color, Urine AMBER (A) YELLOW   APPearance HAZY (A) CLEAR   Specific Gravity, Urine 1.023 1.005 - 1.030   pH 6.0 5.0 - 8.0   Glucose, UA NEGATIVE NEGATIVE mg/dL   Hgb urine dipstick LARGE (A) NEGATIVE   Bilirubin Urine NEGATIVE NEGATIVE   Ketones, ur NEGATIVE NEGATIVE mg/dL   Protein, ur 30 (A) NEGATIVE mg/dL   Nitrite NEGATIVE NEGATIVE   Leukocytes,Ua LARGE (A) NEGATIVE   RBC / HPF >50 (H) 0 - 5 RBC/hpf   WBC, UA 21-50 0 - 5 WBC/hpf   Bacteria, UA RARE (A) NONE SEEN   Squamous Epithelial / LPF 0-5 0 - 5   Mucus PRESENT   Pregnancy, urine POC     Status: Abnormal   Collection Time: 05/13/18 10:08 PM  Result Value Ref Range   Preg Test, Ur POSITIVE (A) NEGATIVE  CBC     Status: None   Collection Time: 05/13/18 10:20 PM  Result Value Ref Range   WBC 8.9 4.0 - 10.5 K/uL   RBC 4.59 3.87 - 5.11 MIL/uL   Hemoglobin 12.1  12.0 - 15.0 g/dL   HCT 16.1 09.6 - 04.5 %   MCV 80.4 80.0 - 100.0 fL   MCH 26.4 26.0 - 34.0 pg   MCHC 32.8 30.0 - 36.0 g/dL   RDW 40.9 81.1 - 91.4 %   Platelets 249 150 - 400 K/uL   nRBC 0.0 0.0 - 0.2 %  hCG, quantitative, pregnancy     Status: Abnormal  Collection Time: 05/13/18 10:20 PM  Result Value Ref Range   hCG, Beta Chain, Quant, S 37,744 (H) <5 mIU/mL   US Ob Less Than 14 Weeks With Ob Transvaginal  Result Date: 05/13/2018 CLINICAL DATA:  Ten weeks, bleeding EXAM: OBSTETRIC <14 WK [place] AND TRANSVAGINAL OB US TECHNIQUE: Both transabdominal and transvaginal ultrasound examinations were performed for complete evaluation of the gestation as well as the maternal uterus, adnexal regions, and pelvic cul-de-sac. Transvaginal technique was performed to assess early pregnancy. COMPARISON:  None. FINDINGS: Intrauterine gestational sac: Single Yolk sac:  Visualized Embryo:  Visualized Cardiac Activity: Not visualized Heart Rate:   bpm MSD: 51.2 mm   10 w   6 d CRL:  5.6 mm   6 w   2 d                  [place] EDC: 12/19/2018 Subchorionic hemorrhage:  None visualized. Maternal uterus/adnexae: No adnexal mass or free fluid. IMPRESSION: Intrauterine gestation with discrepancy and size of the gestational sac and fetal pole. Estimated gestational age [redacted] weeks 6 days by mean sac diameter and 6 weeks 2 days by crown-rump length. No fetal cardiac activity detected. Findings are suspicious but not yet definitive for failed pregnancy. Recommend follow-up US in 10-14 days for definitive diagnosis. This recommendation follows SRU consensus guidelines: Diagnostic Criteria for Nonviable Pregnancy Early in the First Trimester. Malva Limes Med 2013; 244:0102-72. Electronically Signed   By: Charlett Nose M.D.   On: 05/13/2018 23:36   Discussed results of [place] and lab work with patient. Discussed CRL and dating change to [redacted]w[redacted]d based on tonight's [place]. Discussed need to follow up US d/t unknown viability and no FHR at this time.  Educated on threatened miscarriage based on physical examination and currently continued vaginal bleeding. Answered patient questions. Miscarriage precautions given and reasons to return back to MAU. Pt stable at time of discharge.   Interpreter used throughout visit  Assessment and Plan   1. Threatened miscarriage in early pregnancy   2. Abdominal pain during pregnancy   3. Vaginal bleeding during pregnancy   4. [redacted] weeks gestation of pregnancy   5. Normal IUP (intrauterine pregnancy) on prenatal ultrasound, first trimester    Discharge home Follow up in 2 weeks for repeat US for viability- order placed and to follow up at [facility] office for results  Discussed reasons to return to MAU prior to [place]  Miscarriage precautions and pelvic rest   Follow-up Information    [facility] Follow up.   Specialty:  Radiology Why:  Follow up in 2 weeks for repeat ultrasound  Contact information: [address], [address] [id number] [place]c [place] [address] [phone]          Sharyon Cable CNM  05/14/2018, 12:23 AM

## 2018-05-13 NOTE — MAU Note (Signed)
Pt presents to MAU c/o abdominal pain and vaginal bleeding. Pt reports she noticed some brown spotting that started around 1400 and since then at 1900 has become red blood where she is wearing a pad. No recent intercourse. Pt reports the pain to be 2/10 on the pain scale.   Vitals:  98.3 Temp 129/78 BP  16 Resp 75 HR  185.8lbs

## 2018-05-14 LAB — HCG, QUANTITATIVE, PREGNANCY: hCG, Beta Chain, Quant, S: 37744 m[IU]/mL — ABNORMAL HIGH (ref <5)

## 2018-05-16 LAB — GC/CHLAMYDIA PROBE AMP (~~LOC~~) NOT AT ARMC
Chlamydia: NEGATIVE
Neisseria Gonorrhea: NEGATIVE

## 2018-05-19 ENCOUNTER — Ambulatory Visit (HOSPITAL_COMMUNITY): Admission: RE | Admit: 2018-05-19 | Payer: BLUE CROSS/BLUE SHIELD | Source: Ambulatory Visit

## 2018-05-26 ENCOUNTER — Ambulatory Visit (HOSPITAL_COMMUNITY): Payer: BLUE CROSS/BLUE SHIELD

## 2018-05-30 ENCOUNTER — Ambulatory Visit (INDEPENDENT_AMBULATORY_CARE_PROVIDER_SITE_OTHER): Payer: BLUE CROSS/BLUE SHIELD | Admitting: Obstetrics and Gynecology

## 2018-05-30 ENCOUNTER — Other Ambulatory Visit: Payer: Self-pay

## 2018-05-30 ENCOUNTER — Encounter: Payer: Self-pay | Admitting: Obstetrics and Gynecology

## 2018-05-30 VITALS — BP 124/85 | HR 83 | Wt 189.8 lb

## 2018-05-30 DIAGNOSIS — O039 Complete or unspecified spontaneous abortion without complication: Secondary | ICD-10-CM

## 2018-05-30 NOTE — Progress Notes (Signed)
Pt is here for initial OB visit.

## 2018-05-30 NOTE — Progress Notes (Signed)
43 yo [id number] here to initiate prenatal care. Patient reports onset of vaginal bleeding at the end of April which lasted 17 days, at times heavy in flow with passage of clots. Patient was seen in the MAU on 4/24 and was diagnosed with a threatened miscarriage. Patient reports improvement in her vaginal bleeding. This was not a planned pregnancy.   Past Medical History:  Diagnosis Date  . Medical history non-contributory    Past Surgical History:  Procedure Laterality Date  . NO PAST SURGERIES     Family History  Problem Relation Age of Onset  . Cancer Neg Hx   . Diabetes Neg Hx   . Hypertension Neg Hx    Social History   Tobacco Use  . Smoking status: Never Smoker  . Smokeless tobacco: Never Used  Substance Use Topics  . Alcohol use: No  . Drug use: No   ROS See pertinent in HPI  Blood pressure 124/85, pulse 83, weight 189 lb 12.8 oz (86.1 kg), last menstrual period 02/28/2018, unknown if currently breastfeeding. GENERAL: Well-developed, well-nourished female in no acute distress.  ABDOMEN: Soft, nontender, nondistended. No organomegaly. PELVIC: Not performed EXTREMITIES: No cyanosis, clubbing, or edema, 2+ distal pulses.  Pt informed that the ultrasound is considered a limited OB ultrasound and is not intended to be a complete ultrasound exam.  Patient also informed that the ultrasound is not being completed with the intent of assessing for fetal or placental anomalies or any pelvic abnormalities.  Explained that the purpose of today's ultrasound is to assess for  viability.  Patient acknowledges the purpose of the exam and the limitations of the study.  Bedside ultrasound demonstrates a fluid like collection in the uterus without a clear yolk sac or fetal pole  A/P 43 yo with likely miscarriage - will repeat HCG today - Follow up ultrasound as scheduled on 5/13 - Follow up in 1 week to discuss results and initiate contraceptive pills

## 2018-05-31 ENCOUNTER — Encounter: Payer: Self-pay | Admitting: *Deleted

## 2018-05-31 LAB — BETA HCG QUANT (REF LAB): hCG Quant: 116 m[IU]/mL

## 2018-06-01 ENCOUNTER — Ambulatory Visit (HOSPITAL_COMMUNITY)
Admission: RE | Admit: 2018-06-01 | Discharge: 2018-06-01 | Disposition: A | Discharge status: Home / Self Care | Payer: BLUE CROSS/BLUE SHIELD | Source: Ambulatory Visit | Attending: Certified Nurse Midwife | Admitting: Certified Nurse Midwife

## 2018-06-01 ENCOUNTER — Other Ambulatory Visit: Payer: Self-pay

## 2018-06-01 DIAGNOSIS — O200 Threatened abortion: Secondary | ICD-10-CM | POA: Insufficient documentation

## 2018-06-03 ENCOUNTER — Other Ambulatory Visit: Payer: Self-pay | Admitting: Obstetrics and Gynecology

## 2018-06-03 MED ORDER — MISOPROSTOL 200 MCG PO TABS: ORAL_TABLET | ORAL | 1 refills | Status: DC

## 2018-06-15 ENCOUNTER — Ambulatory Visit (INDEPENDENT_AMBULATORY_CARE_PROVIDER_SITE_OTHER): Payer: BLUE CROSS/BLUE SHIELD | Admitting: Obstetrics and Gynecology

## 2018-06-15 ENCOUNTER — Other Ambulatory Visit: Payer: Self-pay

## 2018-06-15 ENCOUNTER — Encounter: Payer: Self-pay | Admitting: Obstetrics and Gynecology

## 2018-06-15 DIAGNOSIS — O039 Complete or unspecified spontaneous abortion without complication: Secondary | ICD-10-CM | POA: Diagnosis not present

## 2018-06-15 MED ORDER — PRENATE MINI 18-0.6-0.4-350 MG PO CAPS: 1.0000 | ORAL_CAPSULE | Freq: Every day | ORAL | 12 refills | Status: DC

## 2018-06-15 NOTE — Progress Notes (Signed)
TELEHEALTH GYNECOLOGY VIRTUAL VIDEO VISIT ENCOUNTER NOTE  Provider location: [facility] [place]   I connected with Lauren Doyle on 06/15/18 at  3:30 PM EDT by WebEx Video Encounter at home and verified that I am speaking with the correct person using two identifiers.   I discussed the limitations, risks, security and privacy concerns of performing an evaluation and management service by telephone and the availability of in person appointments. I also discussed with the patient that there may be a patient responsible charge related to this service. The patient expressed understanding and agreed to proceed.   History:  Lauren Doyle is a 43 y.o. [phone] female being evaluated today for follow up on spontaneous miscarriage. Patient was diagnosed with a miscarriage of 05/13/2018. Follow up ultrasound on 5/13 demonstrated an empty uterus with questionable retained POC and falling quant HCG. Patient was prescribed cytotec on 5/13 and did not use them. She reports persistent intermittent light vaginal bleeding She denies any abnormal discharge, bleeding, pelvic pain or other concerns.       Past Medical History:  Diagnosis Date  . Acid reflux   . Gestational diabetes   . Gestational hypertension   . Medical history non-contributory    Past Surgical History:  Procedure Laterality Date  . NO PAST SURGERIES     The following portions of the patient's history were reviewed and updated as appropriate: allergies, current medications, past family history, past medical history, past social history, past surgical history and problem list.   Health Maintenance:  Normal pap and negative HRHPV on 05/09/18.     Review of Systems:  Pertinent items noted in HPI and remainder of comprehensive ROS otherwise negative.  Physical Exam:   General:  Alert, oriented and cooperative. Patient appears to be in no acute distress.  Mental Status: Normal mood and affect. Normal behavior. Normal  judgment and thought content.   Respiratory: Normal respiratory effort, no problems with respiration noted  Rest of physical exam deferred due to type of encounter  Labs and Imaging No results found for this or any previous visit (from the past 336 hour(s)). US Ob Transvaginal  Result Date: 06/01/2018 CLINICAL DATA:  Assess for viability. EXAM: TRANSVAGINAL OB ULTRASOUND TECHNIQUE: Transvaginal ultrasound was performed for complete evaluation of the gestation as well as the maternal uterus, adnexal regions, and pelvic cul-de-sac. COMPARISON:  05/13/2018 FINDINGS: Intrauterine gestational sac: None Yolk sac:  Not visualize Embryo:  Not visualized Cardiac Activity: Not visualized Subchorionic hemorrhage:  None visualized. Maternal uterus/adnexae: Subchorionic hemorrhage: None Right ovary: Normal Left ovary: Normal Other :There is a complex echogenic structure within the endometrial cavity which measures 2.7 x 2.6 x 1.3 cm. Free fluid:  Small volume of free fluid in the pelvis. IMPRESSION: 1. Findings meet definitive criteria for failed pregnancy. This follows SRU consensus guidelines: Diagnostic Criteria for Nonviable Pregnancy Early in the First Trimester. Macy Mis J Med [id number]. 2. Complex echogenic structure within the endometrial cavity measuring 2.7 cm noted. Cannot rule out retained products of conception. Electronically Signed   By: Signa Kell M.D.   On: 06/01/2018 14:36       Assessment and Doyle:     Spontaneous miscarriage.    Encouraged the patient to take her cytotec prescription. Instructions were reviewed with the assistance of an interpreter Patient is interested in conceiving again. Advised patient to start prenatal vitamins  I discussed the assessment and treatment Doyle with the patient. The patient was provided an  opportunity to ask questions and all were answered. The patient agreed with the Doyle and demonstrated an understanding of the instructions.   The patient was  advised to call back or seek an in-person evaluation/go to the ED if the symptoms worsen or if the condition fails to improve as anticipated.  I provided 15 minutes of face-to-face time during this encounter.   Catalina AntiguaPeggy Elward Nocera, MD [facility]

## 2018-10-15 ENCOUNTER — Inpatient Hospital Stay (HOSPITAL_COMMUNITY)
Admission: AD | Admit: 2018-10-15 | Discharge: 2018-10-15 | Disposition: A | Discharge status: Home / Self Care | Payer: BC Managed Care – PPO | Attending: Obstetrics & Gynecology | Admitting: Obstetrics & Gynecology

## 2018-10-15 ENCOUNTER — Other Ambulatory Visit: Payer: Self-pay

## 2018-10-15 ENCOUNTER — Encounter (HOSPITAL_COMMUNITY): Payer: Self-pay

## 2018-10-15 ENCOUNTER — Inpatient Hospital Stay (HOSPITAL_COMMUNITY): Payer: BC Managed Care – PPO

## 2018-10-15 DIAGNOSIS — R030 Elevated blood-pressure reading, without diagnosis of hypertension: Secondary | ICD-10-CM | POA: Diagnosis not present

## 2018-10-15 DIAGNOSIS — O3680X0 Pregnancy with inconclusive fetal viability, not applicable or unspecified: Secondary | ICD-10-CM

## 2018-10-15 DIAGNOSIS — O26891 Other specified pregnancy related conditions, first trimester: Secondary | ICD-10-CM | POA: Insufficient documentation

## 2018-10-15 DIAGNOSIS — O219 Vomiting of pregnancy, unspecified: Secondary | ICD-10-CM

## 2018-10-15 DIAGNOSIS — Z3A01 Less than 8 weeks gestation of pregnancy: Secondary | ICD-10-CM | POA: Diagnosis not present

## 2018-10-15 LAB — URINALYSIS, ROUTINE W REFLEX MICROSCOPIC
Bilirubin Urine: NEGATIVE
Glucose, UA: NEGATIVE mg/dL
Ketones, ur: NEGATIVE mg/dL
Nitrite: NEGATIVE
Protein, ur: NEGATIVE mg/dL
Specific Gravity, Urine: 1.027 (ref 1.005–1.030)
pH: 5 (ref 5.0–8.0)

## 2018-10-15 LAB — HCG, QUANTITATIVE, PREGNANCY: hCG, Beta Chain, Quant, S: 141978 m[IU]/mL — ABNORMAL HIGH (ref <5)

## 2018-10-15 LAB — POCT PREGNANCY, URINE: Preg Test, Ur: POSITIVE — AB

## 2018-10-15 MED ORDER — PROMETHAZINE HCL 25 MG PO TABS
25.0000 mg | ORAL_TABLET | Freq: Once | ORAL | Status: AC
  Administered 2018-10-15: 25 mg via ORAL
  Filled 2018-10-15: qty 1

## 2018-10-15 MED ORDER — PROMETHAZINE HCL 25 MG PO TABS: 25.0000 mg | ORAL_TABLET | Freq: Four times a day (QID) | ORAL | 0 refills | Status: AC | PRN

## 2018-10-15 NOTE — MAU Provider Note (Signed)
History     CSN: [id number]  Arrival date and time: 10/15/18 1810   First Provider Initiated Contact with Patient 10/15/18 2019      Chief Complaint  Patient presents with  . Emesis  . Nausea   Lauren Doyle is a 43 y.o. [id number] at [redacted]w[redacted]d who has not established prenatal care.  She presents today for Emesis and Nausea.  She states she has been having her symptoms for two weeks and is not taking any medications. She reports that she has thrown up three times today.  She states she has been unable to eat or drink anything.  She states that she has not had any vaginal complaints and has also not had an ultrasound to confirm pregnancy location.      OB History    Gravida  7   Para  5   Term  4   Preterm  1   AB  1   Living  6     SAB  1   TAB      Ectopic      Multiple  1   Live Births  6           Past Medical History:  Diagnosis Date  . Acid reflux   . Gestational diabetes   . Gestational hypertension   . Medical history non-contributory     Past Surgical History:  Procedure Laterality Date  . NO PAST SURGERIES      Family History  Problem Relation Age of Onset  . Cancer Neg Hx   . Diabetes Neg Hx   . Hypertension Neg Hx     Social History   Tobacco Use  . Smoking status: Never Smoker  . Smokeless tobacco: Never Used  Substance Use Topics  . Alcohol use: No  . Drug use: No    Allergies: No Known Allergies  Medications Prior to Admission  Medication Sig Dispense Refill Last Dose  . acetaminophen (TYLENOL) 325 MG tablet Take 2 tablets (650 mg total) by mouth every 4 (four) hours as needed (for pain scale < 4). (Patient not taking: Reported on 11/03/2016) 90 tablet 3   . misoprostol (CYTOTEC) 200 MCG tablet Insert four tablets vaginally 4 tablet 1   . Multiple Vitamin (MULTIVITAMIN) tablet Take 1 tablet by mouth daily. To help with appetite     . Prenat-FeCbn-FeAsp-Meth-FA-DHA (PRENATE MINI) 18-0.6-0.4-350 MG CAPS Take 1 tablet by mouth  daily. 30 capsule 12   . Prenatal Vit-Fe Fumarate-FA (PRENATAL VITAMIN PO) Take by mouth.     . promethazine (PHENERGAN) 25 MG tablet Take 1 tablet (25 mg total) by mouth every 6 (six) hours as needed for nausea or vomiting. (Patient not taking: Reported on 05/30/2018) 30 tablet 0   . Pyridoxine HCl (B-6 PO) Take by mouth.       Review of Systems  Constitutional: Negative for chills and fever.  Respiratory: Negative for cough and shortness of breath.   Gastrointestinal: Positive for nausea and vomiting. Negative for abdominal pain, constipation and diarrhea.  Genitourinary: Negative for difficulty urinating, dysuria, vaginal bleeding and vaginal discharge.  Musculoskeletal: Negative for back pain.  Neurological: Negative for dizziness, light-headedness and headaches.   Physical Exam   Blood pressure (!) 138/94, pulse 79, temperature 98.5 F (36.9 C), temperature source Oral, resp. rate 16, weight 81.9 kg, last menstrual period 08/27/2018, SpO2 100 %, unknown if currently breastfeeding.  Physical Exam  Constitutional: She is oriented to person, place, and time. She appears well-developed  and well-nourished. No distress.  HENT:  Head: Normocephalic and atraumatic.  Eyes: Conjunctivae are normal.  Neck: Normal range of motion.  Cardiovascular: Normal rate.  Respiratory: Effort normal.  Musculoskeletal: Normal range of motion.  Neurological: She is alert and oriented to person, place, and time.  Skin: Skin is warm and dry.  Psychiatric: She has a normal mood and affect. Her behavior is normal.    MAU Course  Procedures Results for orders placed or performed during the hospital encounter of 10/15/18 (from the past 24 hour(s))  Pregnancy, urine POC     Status: Abnormal   Collection Time: 10/15/18  7:05 PM  Result Value Ref Range   Preg Test, Ur POSITIVE (A) NEGATIVE  Urinalysis, Routine w reflex microscopic     Status: Abnormal   Collection Time: 10/15/18  7:07 PM  Result Value Ref  Range   Color, Urine YELLOW YELLOW   APPearance HAZY (A) CLEAR   Specific Gravity, Urine 1.027 1.005 - 1.030   pH 5.0 5.0 - 8.0   Glucose, UA NEGATIVE NEGATIVE mg/dL   Hgb urine dipstick MODERATE (A) NEGATIVE   Bilirubin Urine NEGATIVE NEGATIVE   Ketones, ur NEGATIVE NEGATIVE mg/dL   Protein, ur NEGATIVE NEGATIVE mg/dL   Nitrite NEGATIVE NEGATIVE   Leukocytes,Ua TRACE (A) NEGATIVE   RBC / HPF 0-5 0 - 5 RBC/hpf   WBC, UA 0-5 0 - 5 WBC/hpf   Bacteria, UA RARE (A) NONE SEEN   Squamous Epithelial / LPF 0-5 0 - 5   Mucus PRESENT   [place] Ob Comp Less 14 Wks  Result Date: 10/15/2018 CLINICAL DATA:  Nausea vomiting EXAM: OBSTETRIC <14 WK ULTRASOUND TECHNIQUE: Transabdominal ultrasound was performed for evaluation of the gestation as well as the maternal uterus and adnexal regions. COMPARISON:  None. FINDINGS: Intrauterine gestational sac: Single Yolk sac:  Visualized. Embryo:  Visualized. Cardiac Activity: Visualized. Heart Rate: 139 bpm CRL: 11.1 mm   7 w 1 d                  US EDC: 06/02/2019 Subchorionic hemorrhage:  None visualized. Maternal uterus/adnexae: Bilateral ovaries are within normal limits. Left ovary measures 2.5 x 1.6 x 1.4 cm. The right ovary measures 4.8 x 2.4 x 2.6 cm. IMPRESSION: Single viable intrauterine pregnancy as above. Otherwise no specific abnormality is seen. Electronically Signed   By: Jasmine PangKim  Fujinaga M.D.   On: 10/15/2018 21:16     MDM Exam AntiEmetics Ultrasound Labs: UA, Quant  Assessment and Plan  43 year old [id number] 7.0 weeks by LMP of 08/27/2018 N/V Pregnancy of Unknown Location  -Discussed POC to include oral medications, ultrasound, and labs. -Patient agreeable and without questions. -Give phenergan 25mg  now. -Will send for US. -Patient given ginger ale and tolerating well.   Cherre RobinsJessica L Naliyah Neth, MSN, CNM 10/15/2018, 8:19 PM   Reassessment (9:21 PM) IUP at 7.0 weeks  -Ultrasound findings discussed. -Patient reports improvement of nausea with phenergan  dosing. -Rx sent to pharmacy on file. -Patient also requests PNV. -Informed that she has refills on previous script from May.  Instructed to call and have refill. -Patient informed of elevated blood pressure today and informed of need for initiation of PNC soon. -Offered and declines ob list of providers in the community. -Patient without questions or concerns. -Encouraged to call or return to MAU if symptoms worsen or with the onset of new symptoms. -Discharged to home in improved condition.  Cherre RobinsJessica L Crisol Muecke MSN, CNM Advanced Practice Provider, [facility]  [employer]

## 2018-10-15 NOTE — Discharge Instructions (Signed)
Morning Sickness  Morning sickness is when you feel sick to your stomach (nauseous) during pregnancy. You may feel sick to your stomach and throw up (vomit). You may feel sick in the morning, but you can feel this way at any time of day. Some women feel very sick to their stomach and cannot stop throwing up (hyperemesis gravidarum). Follow these instructions at home: Medicines  Take over-the-counter and prescription medicines only as told by your doctor. Do not take any medicines until you talk with your doctor about them first.  Taking multivitamins before getting pregnant can stop or lessen the harshness of morning sickness. Eating and drinking  Eat dry toast or crackers before getting out of bed.  Eat 5 or 6 small meals a day.  Eat dry and bland foods like rice and baked potatoes.  Do not eat greasy, fatty, or spicy foods.  Have someone cook for you if the smell of food causes you to feel sick or throw up.  If you feel sick to your stomach after taking prenatal vitamins, take them at night or with a snack.  Eat protein when you need a snack. Nuts, yogurt, and cheese are good choices.  Drink fluids throughout the day.  Try ginger ale made with real ginger, ginger tea made from fresh grated ginger, or ginger candies. General instructions  Do not use any products that have nicotine or tobacco in them, such as cigarettes and e-cigarettes. If you need help quitting, ask your doctor.  Use an air purifier to keep the air in your house free of smells.  Get lots of fresh air.  Try to avoid smells that make you feel sick.  Try: ? Wearing a bracelet that is used for seasickness (acupressure wristband). ? Going to a doctor who puts thin needles into certain body points (acupuncture) to improve how you feel. Contact a doctor if:  You need medicine to feel better.  You feel dizzy or light-headed.  You are losing weight. Get help right away if:  You feel very sick to your  stomach and cannot stop throwing up.  You pass out (faint).  You have very bad pain in your belly. Summary  Morning sickness is when you feel sick to your stomach (nauseous) during pregnancy.  You may feel sick in the morning, but you can feel this way at any time of day.  Making some changes to what you eat may help your symptoms go away. This information is not intended to replace advice given to you by your health care provider. Make sure you discuss any questions you have with your health care provider. Document Released: 02/13/2004 Document Revised: 12/18/2016 Document Reviewed: 02/06/2016 Elsevier Patient Education  2020 Elsevier Inc.  

## 2018-10-15 NOTE — MAU Note (Signed)
Lauren Doyle is a 43 y.o.  here in MAU reporting: had + UPT 10/13/18. Nausea and vomiting has been going on for a week. Emesis x 4 in the past 24 hours. Had some spotting a couple of weeks ago but none since. No abdominal pain. No abnormal vaginal discharge.   LMP: 08/27/18  Onset of complaint: a week  Pain score: 0/10  Vitals:   10/15/18 1844  BP: (!) 138/94  Pulse: 79  Resp: 16  Temp: 98.5 F (36.9 C)  SpO2: 100%      Lab orders placed from triage: UA, UPT

## 2018-10-31 ENCOUNTER — Encounter (HOSPITAL_COMMUNITY): Payer: Self-pay

## 2018-10-31 ENCOUNTER — Inpatient Hospital Stay (HOSPITAL_COMMUNITY)
Admission: AD | Admit: 2018-10-31 | Discharge: 2018-10-31 | Disposition: A | Discharge status: Home / Self Care | Payer: BC Managed Care – PPO | Attending: Obstetrics and Gynecology | Admitting: Obstetrics and Gynecology

## 2018-10-31 ENCOUNTER — Other Ambulatory Visit: Payer: Self-pay

## 2018-10-31 DIAGNOSIS — R109 Unspecified abdominal pain: Secondary | ICD-10-CM

## 2018-10-31 DIAGNOSIS — Z3A09 9 weeks gestation of pregnancy: Secondary | ICD-10-CM | POA: Diagnosis not present

## 2018-10-31 DIAGNOSIS — O26891 Other specified pregnancy related conditions, first trimester: Secondary | ICD-10-CM | POA: Diagnosis present

## 2018-10-31 DIAGNOSIS — R1030 Lower abdominal pain, unspecified: Secondary | ICD-10-CM | POA: Insufficient documentation

## 2018-10-31 HISTORY — DX: Gestational (pregnancy-induced) hypertension without significant proteinuria, unspecified trimester: O13.9

## 2018-10-31 LAB — WET PREP, GENITAL
Clue Cells Wet Prep HPF POC: NONE SEEN
Sperm: NONE SEEN
Trich, Wet Prep: NONE SEEN
Yeast Wet Prep HPF POC: NONE SEEN

## 2018-10-31 LAB — URINALYSIS, ROUTINE W REFLEX MICROSCOPIC
Bilirubin Urine: NEGATIVE
Glucose, UA: NEGATIVE mg/dL
Ketones, ur: NEGATIVE mg/dL
Leukocytes,Ua: NEGATIVE
Nitrite: NEGATIVE
Protein, ur: NEGATIVE mg/dL
Specific Gravity, Urine: 1.024 (ref 1.005–1.030)
pH: 5 (ref 5.0–8.0)

## 2018-10-31 NOTE — MAU Provider Note (Addendum)
History     CSN: [id number]  Arrival date and time: 10/31/18 1538   First Provider Initiated Contact with Patient 10/31/18 1728      Chief Complaint  Patient presents with  . Abdominal Pain   HPI Lauren Doyle is a 43 y.o. [phone] at [redacted]w[redacted]d who presents to MAU with suprapubic pain that began 1 week ago. She describes the pain as a constant pressure rated as a 3 on a scale of 1-10. She has not tried any treatments for it, and notes there is no aggrevating or alleviating factors. The pain does not radiate. Reports associated nausea but still has an appetite.  Denies vaginal bleeding, vaginal discharge, pain with urination, fever, chills, vomiting or diarrhea. Last BM this morning   OB History    Gravida  7   Para  5   Term  4   Preterm  1   AB  1   Living  6     SAB  1   TAB      Ectopic      Multiple  1   Live Births  6           Past Medical History:  Diagnosis Date  . Acid reflux   . Gestational diabetes    gestational  . Gestational hypertension   . Medical history non-contributory   . Pregnancy induced hypertension     Past Surgical History:  Procedure Laterality Date  . NO PAST SURGERIES      Family History  Problem Relation Age of Onset  . Cancer Neg Hx   . Diabetes Neg Hx   . Hypertension Neg Hx     Social History   Tobacco Use  . Smoking status: Never Smoker  . Smokeless tobacco: Never Used  Substance Use Topics  . Alcohol use: No  . Drug use: No    Allergies: No Known Allergies  Medications Prior to Admission  Medication Sig Dispense Refill Last Dose  . acetaminophen (TYLENOL) 325 MG tablet Take 2 tablets (650 mg total) by mouth every 4 (four) hours as needed (for pain scale < 4). (Patient not taking: Reported on 11/03/2016) 90 tablet 3   . Prenat-FeCbn-FeAsp-Meth-FA-DHA (PRENATE MINI) 18-0.6-0.4-350 MG CAPS Take 1 tablet by mouth daily. 30 capsule 12   . Prenatal Vit-Fe Fumarate-FA (PRENATAL VITAMIN PO) Take by mouth.      . promethazine (PHENERGAN) 25 MG tablet Take 1 tablet (25 mg total) by mouth every 6 (six) hours as needed for nausea or vomiting. 45 tablet 0   . Pyridoxine HCl (B-6 PO) Take by mouth.       Review of Systems  Constitutional: Negative for chills, fatigue and fever.  Gastrointestinal: Positive for abdominal pain and nausea. Negative for constipation and diarrhea.  Genitourinary: Negative for dysuria, frequency, vaginal bleeding and vaginal discharge.   Physical Exam   Blood pressure 132/84, pulse 86, temperature 98.3 F (36.8 C), temperature source Oral, resp. rate 16, last menstrual period 08/27/2018, SpO2 100 %, unknown if currently breastfeeding.  Physical Exam  Constitutional: She is oriented to person, place, and time. She appears well-developed and well-nourished. No distress.  HENT:  Head: Normocephalic.  Eyes: Pupils are equal, round, and reactive to light. Conjunctivae are normal.  GI: There is no abdominal tenderness. There is no guarding.  Genitourinary: Uterus is not tender. Cervix exhibits no motion tenderness. Right adnexum displays no tenderness. Left adnexum displays no tenderness.    No vaginal discharge, erythema or  bleeding.  No erythema or bleeding in the vagina.    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 no cervical motion tenderness. No adnexal or suprapubic pain on bimanual exam   Neurological: She is alert and oriented to person, place, and time.    MAU Course  Procedures  MDM - fetal heart beat found on U/S; heart rate in the 160s -bimanual exam benign, no tenderness on palpation.  - Gonorrhea/Chalmydia pending - urinalysis and wet prep negative  Assessment and Plan   1. Abdominal pain during pregnancy in first trimester    Pt stable for discharge, informed she could take Tylenol to assist with pain.  Educated patient on normal physiologic changes of early pregnancy (normal to feel pressure and sensation that  "something is there") [facility], advised to keep upcoming appointments Pt informed to return to MAU if heavy bleeding, cramping, etc develops  Alexa A Kimker 10/31/2018, 5:29 PM   I confirm that I have verified the information documented in the physician assistant student's note and that I have also personally reperformed the history, physical exam and all medical decision making activities of this service and have verified that all service and findings are accurately documented in this student's note.   -informal US shows live IUP with FHR of 160.  -urine and wet prep negative; bimanual exam benign.   -reassured patient that she is most likely experiencing normal signs and symptoms of early pregnancy -Patient relieved to know that everything is ok; she plans to keep upcoming OB appt.   Pt informed that the ultrasound is considered a limited OB ultrasound and is not intended to be a complete ultrasound exam.  Patient also informed that the ultrasound is not being completed with the intent of assessing for fetal or placental anomalies or any pelvic abnormalities.  Explained that the purpose of today's ultrasound is to assess for  viability.  Patient acknowledges the purpose of the exam and the limitations of the study.       Marylene Land, CNM 10/31/2018 7:27 PM

## 2018-10-31 NOTE — MAU Note (Signed)
Pt states she has intermittent pressure in her abdm that began x 1week ago; denies pain currently. Denies VB. Also c/o acid reflux accompanied by nausea without vomiting. Has prescription for Phenergan & last took it last night; states it has helped with vomiting, however, she is still very nauseous.

## 2018-10-31 NOTE — Discharge Instructions (Signed)
First Trimester of Pregnancy ° °The first trimester of pregnancy is from week 1 until the end of week 13 (months 1 through 3). During this time, your baby will begin to develop inside you. At 6-8 weeks, the eyes and face are formed, and the heartbeat can be seen on ultrasound. At the end of 12 weeks, all the baby's organs are formed. Prenatal care is all the medical care you receive before the birth of your baby. Make sure you get good prenatal care and follow all of your doctor's instructions. °Follow these instructions at home: °Medicines °· Take over-the-counter and prescription medicines only as told by your doctor. Some medicines are safe and some medicines are not safe during pregnancy. °· Take a prenatal vitamin that contains at least 600 micrograms (mcg) of folic acid. °· If you have trouble pooping (constipation), take medicine that will make your stool soft (stool softener) if your doctor approves. °Eating and drinking ° °· Eat regular, healthy meals. °· Your doctor will tell you the amount of weight gain that is right for you. °· Avoid raw meat and uncooked cheese. °· If you feel sick to your stomach (nauseous) or throw up (vomit): °? Eat 4 or 5 small meals a day instead of 3 large meals. °? Try eating a few soda crackers. °? Drink liquids between meals instead of during meals. °· To prevent constipation: °? Eat foods that are high in fiber, like fresh fruits and vegetables, whole grains, and beans. °? Drink enough fluids to keep your pee (urine) clear or pale yellow. °Activity °· Exercise only as told by your doctor. Stop exercising if you have cramps or pain in your lower belly (abdomen) or low back. °· Do not exercise if it is too hot, too humid, or if you are in a place of great height (high altitude). °· Try to avoid standing for long periods of time. Move your legs often if you must stand in one place for a long time. °· Avoid heavy lifting. °· Wear low-heeled shoes. Sit and stand up  straight. °· You can have sex unless your doctor tells you not to. °Relieving pain and discomfort °· Wear a good support bra if your breasts are sore. °· Take warm water baths (sitz baths) to soothe pain or discomfort caused by hemorrhoids. Use hemorrhoid cream if your doctor says it is okay. °· Rest with your legs raised if you have leg cramps or low back pain. °· If you have puffy, bulging veins (varicose veins) in your legs: °? Wear support hose or compression stockings as told by your doctor. °? Raise (elevate) your feet for 15 minutes, 3-4 times a day. °? Limit salt in your food. °Prenatal care °· Schedule your prenatal visits by the twelfth week of pregnancy. °· Write down your questions. Take them to your prenatal visits. °· Keep all your prenatal visits as told by your doctor. This is important. °Safety °· Wear your seat belt at all times when driving. °· Make a list of emergency phone numbers. The list should include numbers for family, friends, the hospital, and police and fire departments. °General instructions °· Ask your doctor for a referral to a local prenatal class. Begin classes no later than at the start of month 6 of your pregnancy. °· Ask for help if you need counseling or if you need help with nutrition. Your doctor can give you advice or tell you where to go for help. °· Do not use hot tubs, steam   rooms, or saunas. °· Do not douche or use tampons or scented sanitary pads. °· Do not cross your legs for long periods of time. °· Avoid all herbs and alcohol. Avoid drugs that are not approved by your doctor. °· Do not use any tobacco products, including cigarettes, chewing tobacco, and electronic cigarettes. If you need help quitting, ask your doctor. You may get counseling or other support to help you quit. °· Avoid cat litter boxes and soil used by cats. These carry germs that can cause birth defects in the baby and can cause a loss of your baby (miscarriage) or stillbirth. °· Visit your dentist.  At home, brush your teeth with a soft toothbrush. Be gentle when you floss. °Contact a doctor if: °· You are dizzy. °· You have mild cramps or pressure in your lower belly. °· You have a nagging pain in your belly area. °· You continue to feel sick to your stomach, you throw up, or you have watery poop (diarrhea). °· You have a bad smelling fluid coming from your vagina. °· You have pain when you pee (urinate). °· You have increased puffiness (swelling) in your face, hands, legs, or ankles. °Get help right away if: °· You have a fever. °· You are leaking fluid from your vagina. °· You have spotting or bleeding from your vagina. °· You have very bad belly cramping or pain. °· You gain or lose weight rapidly. °· You throw up blood. It may look like coffee grounds. °· You are around people who have German measles, fifth disease, or chickenpox. °· You have a very bad headache. °· You have shortness of breath. °· You have any kind of trauma, such as from a fall or a car accident. °Summary °· The first trimester of pregnancy is from week 1 until the end of week 13 (months 1 through 3). °· To take care of yourself and your unborn baby, you will need to eat healthy meals, take medicines only if your doctor tells you to do so, and do activities that are safe for you and your baby. °· Keep all follow-up visits as told by your doctor. This is important as your doctor will have to ensure that your baby is healthy and growing well. °This information is not intended to replace advice given to you by your health care provider. Make sure you discuss any questions you have with your health care provider. °Document Released: 06/24/2007 Document Revised: 04/28/2018 Document Reviewed: 01/14/2016 °Elsevier Patient Education © 2020 Elsevier Inc. ° °

## 2018-12-26 LAB — OB RESULTS CONSOLE HEPATITIS B SURFACE ANTIGEN: Hepatitis B Surface Ag: NEGATIVE

## 2018-12-26 LAB — OB RESULTS CONSOLE ANTIBODY SCREEN: Antibody Screen: NEGATIVE

## 2018-12-26 LAB — OB RESULTS CONSOLE RUBELLA ANTIBODY, IGM: Rubella: IMMUNE

## 2018-12-26 LAB — OB RESULTS CONSOLE GC/CHLAMYDIA
Chlamydia: NEGATIVE
Gonorrhea: NEGATIVE

## 2018-12-26 LAB — OB RESULTS CONSOLE ABO/RH: RH Type: POSITIVE

## 2018-12-26 LAB — OB RESULTS CONSOLE RPR: RPR: NONREACTIVE

## 2018-12-26 LAB — OB RESULTS CONSOLE HIV ANTIBODY (ROUTINE TESTING): HIV: NONREACTIVE

## 2019-01-20 NOTE — L&D Delivery Note (Signed)
OB/[facility]N Faculty Practice Delivery Note  Lauren Doyle is a 45 y.o. [id number] s/p vaginal delivery at [redacted]w[redacted]d. She was admitted for IOL 2/2 AMA.   ROM: 1h 107m with  fluid GBS Status: negative Maximum Maternal Temperature: 98.7*F  Labor Progress: Admitted and started on pitocin. After no cervical change for >12 hours, pitocin was stopped and a FB was placed along with vaginal cytotec. She was restarted on pitocin and was subsequently AROMed. She progressed to complete and delivered shortly thereafter.  Delivery Date/Time: 05/28/19, 1900 Delivery: Called to room and patient was complete and pushing. Head delivered LOA. One loose nuchal cord present, delivered through. Shoulder and body delivered in usual fashion. Infant placed on mother's abdomen, dried and stimulated. Due to lack of spontaneous cry and poor respiratory effort, cord clamped x 2 and cut. Cord blood drawn. Placenta delivered spontaneously with gentle cord traction. Fundus firm with massage and Pitocin. Labia, perineum, vagina, and cervix were inspected, and found to be intact.   Due to grand-multiparity, 1 g TXA IV and 1000 mg cytotec PR were given.  Placenta: intact, 3 vessel cord, extra lobe noted, to pathology Complications: None Lacerations: None EBL: 50 Analgesia: None  Postpartum Planning [x]  message to sent to schedule follow-up  [x]  vaccines UTD  Infant: female  APGARs 6, 9  weight per medical record  , DO OB/GYN Fellow, Faculty Practice

## 2019-05-08 LAB — OB RESULTS CONSOLE GC/CHLAMYDIA
Chlamydia: NEGATIVE
Gonorrhea: NEGATIVE

## 2019-05-08 LAB — OB RESULTS CONSOLE GBS: GBS: NEGATIVE

## 2019-05-22 ENCOUNTER — Telehealth (HOSPITAL_COMMUNITY): Payer: Self-pay | Admitting: *Deleted

## 2019-05-22 ENCOUNTER — Other Ambulatory Visit (HOSPITAL_COMMUNITY): Payer: Self-pay | Admitting: Advanced Practice Midwife

## 2019-05-22 NOTE — Telephone Encounter (Signed)
Preadmission screen  

## 2019-05-23 NOTE — Telephone Encounter (Signed)
Preadmission screen Interpreter number [id number] 

## 2019-05-24 ENCOUNTER — Telehealth (HOSPITAL_COMMUNITY): Payer: Self-pay | Admitting: *Deleted

## 2019-05-24 ENCOUNTER — Encounter (HOSPITAL_COMMUNITY): Payer: Self-pay | Admitting: *Deleted

## 2019-05-24 ENCOUNTER — Other Ambulatory Visit: Payer: Self-pay | Admitting: Advanced Practice Midwife

## 2019-05-24 NOTE — Telephone Encounter (Signed)
Preadmission screen °Interpreter number [id number] °

## 2019-05-25 ENCOUNTER — Other Ambulatory Visit (HOSPITAL_COMMUNITY)
Admission: RE | Admit: 2019-05-25 | Discharge: 2019-05-25 | Disposition: A | Discharge status: Home / Self Care | Payer: Medicaid Other | Source: Ambulatory Visit | Attending: Family Medicine | Admitting: Family Medicine

## 2019-05-25 DIAGNOSIS — Z01812 Encounter for preprocedural laboratory examination: Secondary | ICD-10-CM | POA: Insufficient documentation

## 2019-05-25 DIAGNOSIS — Z20822 Contact with and (suspected) exposure to covid-19: Secondary | ICD-10-CM | POA: Insufficient documentation

## 2019-05-25 LAB — SARS CORONAVIRUS 2 (TAT 6-24 HRS): SARS Coronavirus 2: NEGATIVE

## 2019-05-27 ENCOUNTER — Inpatient Hospital Stay (HOSPITAL_COMMUNITY): Payer: Medicaid Other

## 2019-05-27 ENCOUNTER — Inpatient Hospital Stay (HOSPITAL_COMMUNITY)
Admission: AD | Admit: 2019-05-27 | Discharge: 2019-05-30 | DRG: 805 | Disposition: A | Discharge status: Home / Self Care | Payer: Medicaid Other | Attending: Family Medicine | Admitting: Family Medicine

## 2019-05-27 ENCOUNTER — Encounter (HOSPITAL_COMMUNITY): Payer: Self-pay | Admitting: Family Medicine

## 2019-05-27 DIAGNOSIS — O26893 Other specified pregnancy related conditions, third trimester: Secondary | ICD-10-CM | POA: Diagnosis present

## 2019-05-27 DIAGNOSIS — O4593 Premature separation of placenta, unspecified, third trimester: Secondary | ICD-10-CM | POA: Diagnosis present

## 2019-05-27 DIAGNOSIS — Z20822 Contact with and (suspected) exposure to covid-19: Secondary | ICD-10-CM | POA: Diagnosis present

## 2019-05-27 DIAGNOSIS — Z3A39 39 weeks gestation of pregnancy: Secondary | ICD-10-CM | POA: Diagnosis not present

## 2019-05-27 DIAGNOSIS — O480 Post-term pregnancy: Principal | ICD-10-CM | POA: Diagnosis present

## 2019-05-27 DIAGNOSIS — O1205 Gestational edema, complicating the puerperium: Secondary | ICD-10-CM | POA: Diagnosis not present

## 2019-05-27 DIAGNOSIS — Z3490 Encounter for supervision of normal pregnancy, unspecified, unspecified trimester: Secondary | ICD-10-CM

## 2019-05-27 LAB — CBC
HCT: 36.7 % (ref 36.0–46.0)
Hemoglobin: 11.8 g/dL — ABNORMAL LOW (ref 12.0–15.0)
MCH: 26.6 pg (ref 26.0–34.0)
MCHC: 32.2 g/dL (ref 30.0–36.0)
MCV: 82.7 fL (ref 80.0–100.0)
Platelets: 239 K/uL (ref 150–400)
RBC: 4.44 MIL/uL (ref 3.87–5.11)
RDW: 14.9 % (ref 11.5–15.5)
WBC: 6.9 K/uL (ref 4.0–10.5)
nRBC: 0 % (ref 0.0–0.2)

## 2019-05-27 LAB — TYPE AND SCREEN
ABO/RH(D): A POS
Antibody Screen: NEGATIVE

## 2019-05-27 LAB — ABO/RH: ABO/RH(D): A POS

## 2019-05-27 MED ORDER — ONDANSETRON HCL 4 MG/2ML IJ SOLN: 4.0000 mg | Freq: Four times a day (QID) | INTRAMUSCULAR | Status: DC | PRN

## 2019-05-27 MED ORDER — OXYTOCIN BOLUS FROM INFUSION
500.0000 mL | Freq: Once | INTRAVENOUS | Status: AC
  Administered 2019-05-28: 500 mL via INTRAVENOUS

## 2019-05-27 MED ORDER — OXYTOCIN 40 UNITS IN NORMAL SALINE INFUSION - SIMPLE MED
1.0000 m[IU]/min | INTRAVENOUS | Status: DC
  Administered 2019-05-27: 6 m[IU]/min via INTRAVENOUS
  Administered 2019-05-27: 2 m[IU]/min via INTRAVENOUS
  Administered 2019-05-28: 12 m[IU]/min via INTRAVENOUS
  Administered 2019-05-28: 8 m[IU]/min via INTRAVENOUS
  Administered 2019-05-28: 2 m[IU]/min via INTRAVENOUS
  Administered 2019-05-28: 8 m[IU]/min via INTRAVENOUS

## 2019-05-27 MED ORDER — SOD CITRATE-CITRIC ACID 500-334 MG/5ML PO SOLN: 30.0000 mL | ORAL | Status: DC | PRN

## 2019-05-27 MED ORDER — LIDOCAINE HCL (PF) 1 % IJ SOLN: 30.0000 mL | INTRAMUSCULAR | Status: DC | PRN

## 2019-05-27 MED ORDER — FLEET ENEMA 7-19 GM/118ML RE ENEM: 1.0000 | ENEMA | RECTAL | Status: DC | PRN

## 2019-05-27 MED ORDER — HYDROXYZINE HCL 50 MG PO TABS: 50.0000 mg | ORAL_TABLET | Freq: Four times a day (QID) | ORAL | Status: DC | PRN

## 2019-05-27 MED ORDER — FENTANYL CITRATE (PF) 100 MCG/2ML IJ SOLN
50.0000 ug | INTRAMUSCULAR | Status: DC | PRN
  Administered 2019-05-28 (×3): 100 ug via INTRAVENOUS
  Filled 2019-05-27 (×3): qty 2

## 2019-05-27 MED ORDER — TERBUTALINE SULFATE 1 MG/ML IJ SOLN: 0.2500 mg | Freq: Once | INTRAMUSCULAR | Status: DC | PRN

## 2019-05-27 MED ORDER — LACTATED RINGERS IV SOLN: INTRAVENOUS | Status: DC

## 2019-05-27 MED ORDER — OXYCODONE-ACETAMINOPHEN 5-325 MG PO TABS: 2.0000 | ORAL_TABLET | ORAL | Status: DC | PRN

## 2019-05-27 MED ORDER — LACTATED RINGERS IV SOLN: 500.0000 mL | INTRAVENOUS | Status: DC | PRN

## 2019-05-27 MED ORDER — OXYTOCIN 40 UNITS IN NORMAL SALINE INFUSION - SIMPLE MED
2.5000 [IU]/h | INTRAVENOUS | Status: DC
  Filled 2019-05-27: qty 1000

## 2019-05-27 MED ORDER — ACETAMINOPHEN 325 MG PO TABS: 650.0000 mg | ORAL_TABLET | ORAL | Status: DC | PRN

## 2019-05-27 MED ORDER — OXYCODONE-ACETAMINOPHEN 5-325 MG PO TABS: 1.0000 | ORAL_TABLET | ORAL | Status: DC | PRN

## 2019-05-27 NOTE — H&P (Signed)
LABOR AND DELIVERY ADMISSION HISTORY AND PHYSICAL NOTE  Lauren Doyle is a 44 y.o. female [id number] with IUP at [redacted]w[redacted]d by LMP c/w [place] dating presenting for IOL for AMA at term.   She reports positive fetal movement. She denies leakage of fluid, vaginal bleeding, or contractions.   She plans on breast feeding. Her contraception plan is: POPs.  Prenatal History/Complications: PNC at [facility]:  @[redacted]w[redacted]d , CWD, normal anatomy, cephalic presentation, anterior placenta, 46%ile, EFW 2839 g  Pregnancy complications:  - AMA  Past Medical History: Past Medical History:  Diagnosis Date  . Acid reflux   . Gestational diabetes    gestational  . Gestational hypertension   . Medical history non-contributory   . Pregnancy induced hypertension     Past Surgical History: Past Surgical History:  Procedure Laterality Date  . NO PAST SURGERIES      Obstetrical History: OB History    Gravida  7   Para  5   Term  4   Preterm  1   AB  1   Living  6     SAB  1   TAB      Ectopic      Multiple  1   Live Births  6           Social History: Social History   Socioeconomic History  . Marital status: Married    Spouse name: Not on file  . Number of children: Not on file  . Years of education: Not on file  . Highest education level: Not on file  Occupational History  . Not on file  Tobacco Use  . Smoking status: Never Smoker  . Smokeless tobacco: Never Used  Substance and Sexual Activity  . Alcohol use: No  . Drug use: No  . Sexual activity: Yes  Other Topics Concern  . Not on file  Social History Narrative  . Not on file   Social Determinants of Health   Financial Resource Strain:   . Difficulty of Paying Living Expenses:   Food Insecurity:   . Worried About Charity fundraiser in the Last Year:   . Arboriculturist in the Last Year:   Transportation Needs:   . Film/video editor (Medical):   Marland Kitchen Lack of Transportation (Non-Medical):   Physical Activity:   .  Days of Exercise per Week:   . Minutes of Exercise per Session:   Stress:   . Feeling of Stress :   Social Connections:   . Frequency of Communication with Friends and Family:   . Frequency of Social Gatherings with Friends and Family:   . Attends Religious Services:   . Active Member of Clubs or Organizations:   . Attends Archivist Meetings:   Marland Kitchen Marital Status:     Family History: Family History  Problem Relation Age of Onset  . Cancer Neg Hx   . Diabetes Neg Hx   . Hypertension Neg Hx     Allergies: No Known Allergies  Medications Prior to Admission  Medication Sig Dispense Refill Last Dose  . aspirin 81 MG chewable tablet Chew 81 mg by mouth daily.   Past Month at Unknown time  . acetaminophen (TYLENOL) 325 MG tablet Take 2 tablets (650 mg total) by mouth every 4 (four) hours as needed (for pain scale < 4). (Patient not taking: Reported on 11/03/2016) 90 tablet 3   . Prenat-FeCbn-FeAsp-Meth-FA-DHA (PRENATE MINI) 18-0.6-0.4-350 MG CAPS Take 1 tablet by  mouth daily. 30 capsule 12   . Prenatal Vit-Fe Fumarate-FA (PRENATAL VITAMIN PO) Take by mouth.     . promethazine (PHENERGAN) 25 MG tablet Take 1 tablet (25 mg total) by mouth every 6 (six) hours as needed for nausea or vomiting. 45 tablet 0   . Pyridoxine HCl (B-6 PO) Take by mouth.        Review of Systems  All systems reviewed and negative except as stated in HPI  Physical Exam Height 5\' 4"  (1.626 m), weight 93.9 kg, last menstrual period 08/27/2018, unknown if currently breastfeeding. General appearance: alert, oriented, NAD Lungs: normal respiratory effort Heart: regular rate Abdomen: soft, non-tender; gravid Extremities: No calf swelling or tenderness Presentation: cephalic by RN SVE  Fetal monitoringBaseline: 120 bpm, Variability: Good {> 6 bpm), Accelerations: Reactive and Decelerations: Absent Uterine activityFrequency: Every 3-4 minutes  Dilation: 2 Effacement (%): 60 Station: -3 Exam by::  [email or web address], RN,Brooke Derinda Late, RN  Prenatal labs: ABO, Rh: --/--/A POS, A POS Performed at [facility], [address] [place], [place]  [phone]/08 2015) Antibody: NEG (05/08 2015) Rubella: Immune (12/07 0000) RPR: Nonreactive (12/07 0000)  HBsAg: Negative (12/07 0000)  HIV: Non-reactive (12/07 0000)  GC/Chlamydia: neg/neg 05/08/2019  GBS: Negative/-- (04/19 0000)  1-hr GTT: normal 03/16/2019 (122) Genetic screening:  Quad screen neg 12/26/2019 Anatomy 14/07/2019: normal  Prenatal Transfer Tool  Maternal Diabetes: No Genetic Screening: Normal Maternal Ultrasounds/Referrals: Normal Fetal Ultrasounds or other Referrals:  None Maternal Substance Abuse:  No Significant Maternal Medications:  None Significant Maternal Lab Results: Group B Strep negative  Results for orders placed or performed during the hospital encounter of 05/27/19 (from the past 24 hour(s))  Type and screen   Collection Time: 05/27/19  8:15 PM  Result Value Ref Range   ABO/RH(D) A POS    Antibody Screen NEG    Sample Expiration      05/30/2019,2359 Performed at [facility], [address] [place], [place]   ABO/Rh   Collection Time: 05/27/19  8:15 PM  Result Value Ref Range   ABO/RH(D)      A POS Performed at [facility], [address], [place], [place]   CBC   Collection Time: 05/27/19  8:30 PM  Result Value Ref Range   WBC 6.9 4.0 - 10.5 K/uL   RBC 4.44 3.87 - 5.11 MIL/uL   Hemoglobin 11.8 (L) 12.0 - 15.0 g/dL   HCT 07/27/19 30.0 - 92.3 %   MCV 82.7 80.0 - 100.0 fL   MCH 26.6 26.0 - 34.0 pg   MCHC 32.2 30.0 - 36.0 g/dL   RDW 30.0 76.2 - 26.3 %   Platelets 239 150 - 400 K/uL   nRBC 0.0 0.0 - 0.2 %    Patient Active Problem List   Diagnosis Date Noted  . Post-dates pregnancy 05/27/2019  . Active labor at term 03/25/2015  . Pregnancy 03/25/2015  . NSVD (normal spontaneous vaginal delivery) 03/25/2015    Assessment: Lauren Doyle is a 44 y.o. 45 at  [redacted]w[redacted]d here for AMA IOL at term.  #Labor: Start pitocin 2x2, declines foley bulb placement #Pain: IV pain meds PRN, epidural upon request #FWB: Cat I #GBS/ID: neg #COVID: swab negative 05/25/2019 #MOF: Breast #MOC: Girl #Circ: n/a  #Grand multip: TXA at delivery, low threshold for uterotonics  07/25/2019 [facility] 05/27/2019, 11:05 PM

## 2019-05-28 ENCOUNTER — Other Ambulatory Visit: Payer: Self-pay

## 2019-05-28 ENCOUNTER — Encounter (HOSPITAL_COMMUNITY): Payer: Self-pay | Admitting: Family Medicine

## 2019-05-28 DIAGNOSIS — Z3A39 39 weeks gestation of pregnancy: Secondary | ICD-10-CM

## 2019-05-28 LAB — CBC
HCT: 36.2 % (ref 36.0–46.0)
Hemoglobin: 11.4 g/dL — ABNORMAL LOW (ref 12.0–15.0)
MCH: 26 pg (ref 26.0–34.0)
MCHC: 31.5 g/dL (ref 30.0–36.0)
MCV: 82.6 fL (ref 80.0–100.0)
Platelets: 205 10*3/uL (ref 150–400)
RBC: 4.38 MIL/uL (ref 3.87–5.11)
RDW: 14.9 % (ref 11.5–15.5)
WBC: 7.1 10*3/uL (ref 4.0–10.5)
nRBC: 0 % (ref 0.0–0.2)

## 2019-05-28 LAB — DIC (DISSEMINATED INTRAVASCULAR COAGULATION)PANEL
D-Dimer, Quant: 1.28 ug/mL-FEU — ABNORMAL HIGH (ref 0.00–0.50)
Fibrinogen: 379 mg/dL (ref 210–475)
INR: 1 (ref 0.8–1.2)
Platelets: 202 10*3/uL (ref 150–400)
Prothrombin Time: 13 seconds (ref 11.4–15.2)
Smear Review: NONE SEEN
aPTT: 26 seconds (ref 24–36)

## 2019-05-28 MED ORDER — PRENATAL MULTIVITAMIN CH
1.0000 | ORAL_TABLET | Freq: Every day | ORAL | Status: DC
  Administered 2019-05-29 – 2019-05-30 (×2): 1 via ORAL
  Filled 2019-05-28 (×2): qty 1

## 2019-05-28 MED ORDER — WITCH HAZEL-GLYCERIN EX PADS: 1.0000 "application " | MEDICATED_PAD | CUTANEOUS | Status: DC | PRN

## 2019-05-28 MED ORDER — OXYCODONE HCL 5 MG PO TABS: 5.0000 mg | ORAL_TABLET | ORAL | Status: DC | PRN

## 2019-05-28 MED ORDER — TRANEXAMIC ACID-NACL 1000-0.7 MG/100ML-% IV SOLN
INTRAVENOUS | Status: AC
  Filled 2019-05-28: qty 100

## 2019-05-28 MED ORDER — IBUPROFEN 600 MG PO TABS
600.0000 mg | ORAL_TABLET | Freq: Four times a day (QID) | ORAL | Status: DC
  Administered 2019-05-28 – 2019-05-30 (×7): 600 mg via ORAL
  Filled 2019-05-28 (×7): qty 1

## 2019-05-28 MED ORDER — MISOPROSTOL 25 MCG QUARTER TABLET
25.0000 ug | ORAL_TABLET | Freq: Once | ORAL | Status: AC
  Administered 2019-05-28: 25 ug via VAGINAL

## 2019-05-28 MED ORDER — MISOPROSTOL 50MCG HALF TABLET: 50.0000 ug | ORAL_TABLET | ORAL | Status: DC

## 2019-05-28 MED ORDER — ZOLPIDEM TARTRATE 5 MG PO TABS: 5.0000 mg | ORAL_TABLET | Freq: Every evening | ORAL | Status: DC | PRN

## 2019-05-28 MED ORDER — COCONUT OIL OIL: 1.0000 "application " | TOPICAL_OIL | Status: DC | PRN

## 2019-05-28 MED ORDER — DIBUCAINE (PERIANAL) 1 % EX OINT: 1.0000 "application " | TOPICAL_OINTMENT | CUTANEOUS | Status: DC | PRN

## 2019-05-28 MED ORDER — MISOPROSTOL 25 MCG QUARTER TABLET
ORAL_TABLET | ORAL | Status: AC
  Filled 2019-05-28: qty 1

## 2019-05-28 MED ORDER — BENZOCAINE-MENTHOL 20-0.5 % EX AERO: 1.0000 "application " | INHALATION_SPRAY | CUTANEOUS | Status: DC | PRN

## 2019-05-28 MED ORDER — ONDANSETRON HCL 4 MG/2ML IJ SOLN: 4.0000 mg | INTRAMUSCULAR | Status: DC | PRN

## 2019-05-28 MED ORDER — OXYCODONE HCL 5 MG PO TABS: 10.0000 mg | ORAL_TABLET | ORAL | Status: DC | PRN

## 2019-05-28 MED ORDER — MISOPROSTOL 200 MCG PO TABS
ORAL_TABLET | ORAL | Status: AC
  Filled 2019-05-28: qty 5

## 2019-05-28 MED ORDER — ONDANSETRON HCL 4 MG PO TABS: 4.0000 mg | ORAL_TABLET | ORAL | Status: DC | PRN

## 2019-05-28 MED ORDER — ACETAMINOPHEN 325 MG PO TABS: 650.0000 mg | ORAL_TABLET | ORAL | Status: DC | PRN

## 2019-05-28 MED ORDER — DIPHENHYDRAMINE HCL 25 MG PO CAPS
25.0000 mg | ORAL_CAPSULE | Freq: Four times a day (QID) | ORAL | Status: DC | PRN
  Administered 2019-05-30: 25 mg via ORAL
  Filled 2019-05-28: qty 1

## 2019-05-28 MED ORDER — SIMETHICONE 80 MG PO CHEW: 80.0000 mg | CHEWABLE_TABLET | ORAL | Status: DC | PRN

## 2019-05-28 MED ORDER — MISOPROSTOL 200 MCG PO TABS
1000.0000 ug | ORAL_TABLET | Freq: Once | ORAL | Status: AC
  Administered 2019-05-28: 1000 ug via RECTAL

## 2019-05-28 MED ORDER — SENNOSIDES-DOCUSATE SODIUM 8.6-50 MG PO TABS
2.0000 | ORAL_TABLET | ORAL | Status: DC
  Administered 2019-05-28 – 2019-05-29 (×2): 2 via ORAL
  Filled 2019-05-28 (×2): qty 2

## 2019-05-28 MED ORDER — TRANEXAMIC ACID-NACL 1000-0.7 MG/100ML-% IV SOLN
1000.0000 mg | Freq: Once | INTRAVENOUS | Status: AC
  Administered 2019-05-28: 1000 mg via INTRAVENOUS

## 2019-05-28 MED ORDER — TETANUS-DIPHTH-ACELL PERTUSSIS 5-2.5-18.5 LF-MCG/0.5 IM SUSP: 0.5000 mL | Freq: Once | INTRAMUSCULAR | Status: DC

## 2019-05-28 NOTE — Discharge Instructions (Signed)

## 2019-05-28 NOTE — Progress Notes (Signed)
Lauren Doyle is a 44 y.o. [id number] at [redacted]w[redacted]d admitted for IOL 2/2 AMA  Subjective: Not really feeling contractions.  Objective: BP 113/61   Pulse 80   Temp (!) 97.5 F (36.4 C) (Oral)   Resp 18   Ht 5\' 4"  (1.626 m)   Wt 93.9 kg   LMP 08/27/2018   BMI 35.53 kg/m  No intake/output data recorded.  FHT:  FHR: 130 bpm, variability: moderate,  accelerations:  Present,  decelerations:  Absent UC:   none  SVE:   Dilation: 2.5 Effacement (%): 60 Station: -3 Exam by:: Sparicino, DO  Pitocin @ 14 mu/min  Labs: Lab Results  Component Value Date   WBC 6.9 05/27/2019   HGB 11.8 (L) 05/27/2019   HCT 36.7 05/27/2019   MCV 82.7 05/27/2019   PLT 239 05/27/2019    Assessment / Plan: 44 y.o. 45 at [redacted]w[redacted]d here for IOL for AMA.  Labor: Pitocin from 2159 last night without significant cervical change. Bishop score 3. Stop Pit and give cytotec. FB placed at this check, some dark-red bleeding with placement- suspect placental abruption? Repeat CBC and a DIC panel ordered. Dr. 2160 made aware. Fetal Wellbeing:  Cat I Pain Control:  IV pain meds or nitrous PRN, though has expressed desire to labor unmedicated GBS: neg Anticipated MOD:  SVD  Grand multip: Discussed PPH risk with patient and spouse, including but not limited to more people present at delivery, additional medications or procedures requires. We will give TXA at delivery.  Shawnie Pons DO OB Fellow, Faculty Practice 05/28/2019, 9:37 AM

## 2019-05-28 NOTE — Lactation Note (Signed)
This note was copied from a baby's chart. Lactation Consultation Note Baby is 3 hrs old. Mom BF baby when LC entered rm. Used Arabic Interpreter Amy [phone] Mom speaks good English and really didn't need interpreter. The interpreter stated this as well. Mom has 6 other children. Oldest 42 yrs old and youngest 37 yrs old. Mom BF them from 6 months to 2 yrs. Each child different but had no trouble BF. Mom stated she didn't need anything or have any concerns w/BF. Reminded of newborn behavior and feeding habits. Mom stated she remembers. Baby feeding w/good body alignment and positioning. Asked mom if she would like the rail up in case she feel asleep mom stated no. Encouraged STS and reminded the importance of I&O.  Encouraged mom to call for assistance or questions if needed. Lactation brochure given.   Patient Name: Lauren Doyle [id number] Date: 05/28/2019 Reason for consult: Initial assessment;Term   Maternal Data Has patient been taught Hand Expression?: Yes Does the patient have breastfeeding experience prior to this delivery?: Yes  Feeding Feeding Type: Breast Fed  LATCH Score Latch: Grasps breast easily, tongue down, lips flanged, rhythmical sucking.  Audible Swallowing: A few with stimulation  Type of Nipple: Everted at rest and after stimulation  Comfort (Breast/Nipple): Soft / non-tender  Hold (Positioning): No assistance needed to correctly position infant at breast.  LATCH Score: 9  Interventions Interventions: Breast feeding basics reviewed;Skin to skin  Lactation Tools Discussed/Used     Consult Status Consult Status: PRN Date: 05/29/19 Follow-up type: In-patient    Charyl Dancer 05/28/2019, 10:58 PM

## 2019-05-28 NOTE — Discharge Summary (Addendum)
Postpartum Discharge Summary     Patient Name: Lauren Doyle DOB: [birth year] MRN: [id number]  Date of admission: 05/27/2019 Delivering Provider: Merilyn Baba   Date of discharge: 05/30/2019  Admitting diagnosis: Post-dates pregnancy [O48.0] Intrauterine pregnancy: [redacted]w[redacted]d    Secondary diagnosis:  Active Problems:   Encounter for induction of labor   Post-dates pregnancy  Additional problems: None     Discharge diagnosis: Term Pregnancy Delivered                                                                                                Post partum procedures:None  Augmentation: AROM, Pitocin, Cytotec and Foley Balloon  Complications: None  Hospital course:  Induction of Labor With Vaginal Delivery   44 y.o. yo [id number] admitted to the hospital 05/27/2019 for induction of labor.  Indication for induction: AMA.  Patient had an uncomplicated labor course as follows:  Admitted and started on pitocin. After no cervical change for >12 hours, pitocin was stopped and a FB was placed along with vaginal cytotec. She was restarted on pitocin and was subsequently AROMed. She progressed to complete and delivered shortly thereafter.  Membrane Rupture Time/Date: 5:29 PM ,05/28/2019   Intrapartum Procedures: Episiotomy: None [1]                                         Lacerations:  None [1]  Patient had delivery of a Viable infant.  Information for the patient's newborn:  AlJeani, [place]0[[id number]]Delivery Method: Vaginal, Spontaneous(Filed from Delivery Summary)    05/28/2019  Details of delivery can be found in separate delivery note.  Patient had a routine postpartum course. Patient is discharged home 05/30/19. Delivery time: 7:00 PM    Magnesium Sulfate received: No BMZ received: No Rhophylac:N/A MMR:N/A Transfusion:No  Physical exam  Vitals:   05/29/19 0945 05/29/19 1446 05/29/19 2058 05/30/19 0517  BP: 115/69 128/74 106/66 112/83  Pulse: 81 90 64 91  Resp:  16 16 16 16   Temp: 98 F (36.7 C) 98.3 F (36.8 C) 98.1 F (36.7 C) 97.6 F (36.4 C)  TempSrc: Oral Oral Oral Oral  SpO2:    100%  Weight:      Height:       General: alert, cooperative and no distress Lochia: appropriate Uterine Fundus: firm Incision: Healing well with no significant drainage DVT Evaluation: No evidence of DVT seen on physical exam. Labs: Lab Results  Component Value Date   WBC 11.6 (H) 05/29/2019   HGB 11.2 (L) 05/29/2019   HCT 35.9 (L) 05/29/2019   MCV 83.1 05/29/2019   PLT 208 05/29/2019   CMP Latest Ref Rng & Units 11/03/2016  Glucose 65 - 99 mg/dL 84  BUN 6 - 20 mg/dL 8  Creatinine 0.44 - 1.00 mg/dL 0.58  Sodium 135 - 145 mmol/L 136  Potassium 3.5 - 5.1 mmol/L 3.4(L)  Chloride 101 - 111 mmol/L 105  CO2 22 - 32 mmol/L 22  Calcium 8.9 -  10.3 mg/dL 9.3  Total Protein 6.5 - 8.1 g/dL -  Total Bilirubin 0.3 - 1.2 mg/dL -  Alkaline Phos 38 - 126 U/L -  AST 15 - 41 U/L -  ALT 14 - 54 U/L -   Edinburgh Score: Edinburgh Postnatal Depression Scale Screening Tool 05/28/2019  I have been able to laugh and see the funny side of things. 0  I have looked forward with enjoyment to things. 0  I have blamed myself unnecessarily when things went wrong. 0  I have been anxious or worried for no good reason. 0  I have felt scared or panicky for no good reason. 0  Things have been getting on top of me. 0  I have been so unhappy that I have had difficulty sleeping. 0  I have felt sad or miserable. 0  I have been so unhappy that I have been crying. 0  The thought of harming myself has occurred to me. 0  Edinburgh Postnatal Depression Scale Total 0    Discharge instruction: per After Visit Summary and "Baby and Me Booklet".  After visit meds:  Allergies as of 05/30/2019   No Known Allergies     Medication List    STOP taking these medications   aspirin 81 MG chewable tablet   B-6 PO   Prenate Mini 18-0.6-0.4-350 MG Caps     TAKE these medications    acetaminophen 325 MG tablet Commonly known as: Tylenol Take 2 tablets (650 mg total) by mouth every 4 (four) hours as needed (for pain scale < 4).   ibuprofen 600 MG tablet Commonly known as: ADVIL Take 1 tablet (600 mg total) by mouth every 6 (six) hours as needed.   promethazine 25 MG tablet Commonly known as: PHENERGAN Take 1 tablet (25 mg total) by mouth every 6 (six) hours as needed for nausea or vomiting.   WesTab Plus 27-1 MG Tabs Take 1 tablet by mouth daily.       Diet: routine diet  Activity: Advance as tolerated. Pelvic rest for 6 weeks.   Outpatient follow up:6 weeks Follow up Appt:No future appointments. Follow up Visit: Follow-up Information    Department, [facility] Follow up.   Contact information: [place] [address] [phone]             Please schedule this patient for Postpartum visit in: 6 weeks with the following provider: Any provider Virtual For C/S patients schedule nurse incision check in weeks 2 weeks: no Low risk pregnancy complicated by: Advanced Maternal Age, Grand Multiparity Delivery mode:  SVD Anticipated Birth Control:  POPs PP Procedures needed: None  Schedule Integrated BH visit: no     Newborn Data: Live born female  Birth Weight:  3016g APGAR: 80, 9  Newborn Delivery   Birth date/time: 05/28/2019 19:00:00 Delivery type: Vaginal, Spontaneous      Baby Feeding: Breast Disposition:home with mother   05/30/2019 Chauncey Mann, MD

## 2019-05-28 NOTE — Progress Notes (Signed)
LABOR PROGRESS NOTE  Lauren Doyle is a 45 y.o. [id number] at [redacted]w[redacted]d  admitted for IOL for AMA.  Subjective: Strip review  Objective: BP 118/81   Pulse 76   Temp (!) 97.3 F (36.3 C) (Oral)   Resp 16   Ht 5\' 4"  (1.626 m)   Wt 93.9 kg   LMP 08/27/2018   BMI 35.53 kg/m  or  Vitals:   05/28/19 0101 05/28/19 0130 05/28/19 0158 05/28/19 0200  BP: 110/66 110/63  118/81  Pulse: 73 74  76  Resp: 16 16 16    Temp:   (!) 97.3 F (36.3 C)   TempSrc:   Oral   Weight:      Height:         Dilation: 3 Effacement (%): 70 Cervical Position: Middle Station: -2 Presentation: Vertex Exam by:: , RN FHT: baseline rate 115, moderate varibility, +acel, -decel Toco: poor tracing  Labs: Lab Results  Component Value Date   WBC 6.9 05/27/2019   HGB 11.8 (L) 05/27/2019   HCT 36.7 05/27/2019   MCV 82.7 05/27/2019   PLT 239 05/27/2019    Patient Active Problem List   Diagnosis Date Noted  . Post-dates pregnancy 05/27/2019  . Active labor at term 03/25/2015  . Pregnancy 03/25/2015  . NSVD (normal spontaneous vaginal delivery) 03/25/2015    Assessment / Plan: 44 y.o. 05/25/2015 at [redacted]w[redacted]d here for IOL for AMA.  Labor: started on pitocin with some mild progression in exam, cont pitocin augmentation and consider AROM at next check.  Fetal Wellbeing:  Cat I Pain Control:  IV pain meds or nitrous PRN, though has expressed desire to labor unmedicated GBS: neg Anticipated MOD:  SVD  Grand multip: TXA at delivery  [id number], MD/MPH OB Fellow  05/28/2019, 2:27 AM

## 2019-05-28 NOTE — Progress Notes (Signed)
Lauren Doyle is a 44 y.o. [phone] at [redacted]w[redacted]d admitted for IOL 2/2 AMA  Subjective: Feeling more pain with contractions  Objective: BP 107/60   Pulse 77   Temp 98.7 F (37.1 C) (Oral)   Resp 18   Ht 5\' 4"  (1.626 m)   Wt 93.9 kg   LMP 08/27/2018   BMI 35.53 kg/m  No intake/output data recorded.  FHT:  FHR: 125 bpm, variability: moderate,  accelerations:  Present,  decelerations:  Absent UC:   regular, every 2-3 minutes  SVE:   Dilation: 5 Effacement (%): 60, 70 Station: -3 Exam by:: Sparicino, CNM  Pitocin @ 8 mu/min  Labs: Lab Results  Component Value Date   WBC 7.1 05/28/2019   HGB 11.4 (L) 05/28/2019   HCT 36.2 05/28/2019   MCV 82.6 05/28/2019   PLT 205 05/28/2019   PLT 202 05/28/2019    Assessment / Plan: 52 y.45 at [redacted]w[redacted]d here for IOL for AMA.  Labor:Pitocin from 2159 last night without significant cervical change. S/p FB and cyto x1. Pit restarted @1330 . AROM at this check with clear fluid. There was some dark-red bleeding with placement of FB, and again with AROM- suspect placental abruption. Repeat CBC and a DIC panel normal. Dr. 2160 aware. Fetal Wellbeing:Cat I Pain Control:IV pain meds or nitrous PRN, though has expressed desire to labor unmedicated GBS:neg Anticipated MOD:SVD  Grand multip:Discussed PPH risk with patient and spouse, including but not limited to more people present at delivery, additional medications or procedures requires. We will give TXA at delivery.  DO OB Fellow, Faculty Practice 05/28/2019, 5:32 PM

## 2019-05-29 DIAGNOSIS — Z3A39 39 weeks gestation of pregnancy: Secondary | ICD-10-CM

## 2019-05-29 LAB — CBC
HCT: 35.9 % — ABNORMAL LOW (ref 36.0–46.0)
Hemoglobin: 11.2 g/dL — ABNORMAL LOW (ref 12.0–15.0)
MCH: 25.9 pg — ABNORMAL LOW (ref 26.0–34.0)
MCHC: 31.2 g/dL (ref 30.0–36.0)
MCV: 83.1 fL (ref 80.0–100.0)
Platelets: 208 K/uL (ref 150–400)
RBC: 4.32 MIL/uL (ref 3.87–5.11)
RDW: 14.9 % (ref 11.5–15.5)
WBC: 11.6 K/uL — ABNORMAL HIGH (ref 4.0–10.5)
nRBC: 0 % (ref 0.0–0.2)

## 2019-05-29 LAB — RPR: RPR Ser Ql: NONREACTIVE

## 2019-05-29 NOTE — Progress Notes (Addendum)
Post Partum Day 1 Subjective:  Pt ambulatory in the room and is eating well. Pain is well controlled. Reports voiding and tolerating po. Denies flatus and bowel movements.  Endorses abdominal cramping with breastfeeding.  States her swelling in her legs is improving.    Objective: Blood pressure 118/75, pulse 80, temperature 98.2 F (36.8 C), temperature source Oral, resp. rate 16, height 5\' 4"  (1.626 m), weight 93.9 kg, last menstrual period 08/27/2018, SpO2 100 %, unknown if currently breastfeeding.  Physical Exam:  General: alert and no distress Lochia: appropriate Uterine Fundus: firm Incision: NA DVT Evaluation: No cords or calf tenderness. Calf/Ankle edema is present.  Recent Labs    05/27/19 2030 05/28/19 1014  HGB 11.8* 11.4*  HCT 36.7 36.2    Assessment/Plan: Plan for discharge tomorrow and Breastfeeding. Advised patient to keep feet elevated to help with LE edema.    LOS: 2 days   07/28/19, DO  05/29/2019, 7:38 AM   I saw and evaluated the patient. I agree with the findings and the plan of care as documented in the resident's note. Can trial Lasix if swelling persists. POP's on discharge.   07/29/2019, MD [facility] Family Medicine Fellow, [facility] for [facility]

## 2019-05-30 LAB — SURGICAL PATHOLOGY

## 2019-05-30 MED ORDER — IBUPROFEN 600 MG PO TABS: 600.0000 mg | ORAL_TABLET | Freq: Four times a day (QID) | ORAL | 0 refills | Status: DC

## 2019-05-30 MED ORDER — IBUPROFEN 600 MG PO TABS: 600.0000 mg | ORAL_TABLET | Freq: Four times a day (QID) | ORAL | 0 refills | Status: AC | PRN

## 2019-05-30 MED ORDER — FUROSEMIDE 20 MG PO TABS
10.0000 mg | ORAL_TABLET | Freq: Once | ORAL | Status: AC
  Administered 2019-05-30: 10 mg via ORAL
  Filled 2019-05-30: qty 0.5

## 2019-05-30 NOTE — Lactation Note (Signed)
This note was copied from a baby's chart. Lactation Consultation Note  Patient Name: Lauren Doyle IOEVO'J Date: 05/30/2019 Reason for consult: Follow-up assessment   Mother is a P53, infant is 44 hours old.  Mother leaning over infant breastfeeding without any pillow support when LC arrived in the room.  Reports that her nipples are very sore.   Reviewed hand expression with mother. Observed large drops of colostrum. Mother was given a harmony hand pump with instructions. Mothers nipples are erect with compressible breast tissue. observed slight break down at the bottom of both nipples.   Comfort gels given.   Mother was observed with infant latched on at the left breast. Observed infant suckling with audible swallows. Infant sustained latch for 5  mins.  Mother taught to bring infant to breast and hug closely. Advised to wait for infant to have wide open mouth and get large amt of breast in his mouth.  Mother very receptive to all teaching.   Discussed treatment and prevention of engorgement.  Mother to continue to cue base feed infant and feed at least 8-12 times or more in 24 hours and advised to allow for cluster feeding infant as needed.   Mother to continue to due STS. Mother is aware of available LC services at [facility], BFSG'S, OP Dept, and phone # for questions or concerns about breastfeeding.  Mother receptive to all teaching and plan of care.     Maternal Data    Feeding Feeding Type: Breast Fed  LATCH Score Latch: Grasps breast easily, tongue down, lips flanged, rhythmical sucking.  Audible Swallowing: A few with stimulation  Type of Nipple: Everted at rest and after stimulation  Comfort (Breast/Nipple): Soft / non-tender  Hold (Positioning): No assistance needed to correctly position infant at breast.  LATCH Score: 9  Interventions Interventions: Assisted with latch;Skin to skin;Hand express;Comfort gels;DEBP  Lactation Tools Discussed/Used     Consult  Status Consult Status: Complete    Michel Bickers 05/30/2019, 10:46 AM
